# Patient Record
Sex: Female | Born: 1975 | Race: White | Hispanic: No | Marital: Married | Smoking: Never smoker
Health system: Southern US, Community
[De-identification: ages and names within clinical notes are randomized; demographics above are authoritative.]

## PROBLEM LIST (undated history)

## (undated) DIAGNOSIS — N94810 Vulvar vestibulitis: Secondary | ICD-10-CM

## (undated) DIAGNOSIS — Z8619 Personal history of other infectious and parasitic diseases: Secondary | ICD-10-CM

## (undated) DIAGNOSIS — R51 Headache: Secondary | ICD-10-CM

## (undated) DIAGNOSIS — D62 Acute posthemorrhagic anemia: Secondary | ICD-10-CM

## (undated) DIAGNOSIS — F329 Major depressive disorder, single episode, unspecified: Secondary | ICD-10-CM

## (undated) DIAGNOSIS — IMO0002 Reserved for concepts with insufficient information to code with codable children: Secondary | ICD-10-CM

## (undated) DIAGNOSIS — D649 Anemia, unspecified: Secondary | ICD-10-CM

## (undated) DIAGNOSIS — D509 Iron deficiency anemia, unspecified: Secondary | ICD-10-CM

## (undated) DIAGNOSIS — N3090 Cystitis, unspecified without hematuria: Secondary | ICD-10-CM

## (undated) DIAGNOSIS — K649 Unspecified hemorrhoids: Secondary | ICD-10-CM

## (undated) DIAGNOSIS — F419 Anxiety disorder, unspecified: Secondary | ICD-10-CM

## (undated) DIAGNOSIS — F32A Depression, unspecified: Secondary | ICD-10-CM

## (undated) DIAGNOSIS — O99019 Anemia complicating pregnancy, unspecified trimester: Secondary | ICD-10-CM

## (undated) HISTORY — DX: Major depressive disorder, single episode, unspecified: F32.9

## (undated) HISTORY — DX: Unspecified hemorrhoids: K64.9

## (undated) HISTORY — DX: Depression, unspecified: F32.A

## (undated) HISTORY — DX: Anxiety disorder, unspecified: F41.9

## (undated) HISTORY — DX: Personal history of other infectious and parasitic diseases: Z86.19

## (undated) HISTORY — DX: Anemia, unspecified: D64.9

## (undated) HISTORY — PX: NO PAST SURGERIES: SHX2092

## (undated) HISTORY — DX: Reserved for concepts with insufficient information to code with codable children: IMO0002

## (undated) HISTORY — DX: Headache: R51

## (undated) HISTORY — DX: Cystitis, unspecified without hematuria: N30.90

## (undated) HISTORY — DX: Vulvar vestibulitis: N94.810

---

## 2005-04-29 ENCOUNTER — Ambulatory Visit: Admission: RE | Admit: 2005-04-29 | Discharge: 2005-04-29 | Payer: Self-pay | Admitting: *Deleted

## 2009-07-07 ENCOUNTER — Ambulatory Visit: Payer: Self-pay | Admitting: Family Medicine

## 2009-07-07 DIAGNOSIS — J069 Acute upper respiratory infection, unspecified: Secondary | ICD-10-CM | POA: Insufficient documentation

## 2009-07-22 ENCOUNTER — Ambulatory Visit: Payer: Self-pay | Admitting: Family Medicine

## 2009-07-22 DIAGNOSIS — J209 Acute bronchitis, unspecified: Secondary | ICD-10-CM

## 2009-07-22 DIAGNOSIS — R0789 Other chest pain: Secondary | ICD-10-CM

## 2009-07-22 DIAGNOSIS — R079 Chest pain, unspecified: Secondary | ICD-10-CM | POA: Insufficient documentation

## 2009-07-22 DIAGNOSIS — J019 Acute sinusitis, unspecified: Secondary | ICD-10-CM

## 2009-07-30 ENCOUNTER — Ambulatory Visit: Payer: Self-pay | Admitting: Family Medicine

## 2009-08-07 ENCOUNTER — Encounter: Admission: RE | Admit: 2009-08-07 | Discharge: 2009-08-07 | Payer: Self-pay | Admitting: Family Medicine

## 2009-08-20 ENCOUNTER — Ambulatory Visit: Payer: Self-pay | Admitting: Family Medicine

## 2009-08-20 DIAGNOSIS — J189 Pneumonia, unspecified organism: Secondary | ICD-10-CM | POA: Insufficient documentation

## 2010-06-13 NOTE — L&D Delivery Note (Signed)
Delivery Note At 4:07 PM a viable and healthy female was delivered via Vaginal, Spontaneous Delivery (Presentation: Middle Occiput Anterior).  APGAR: 9, 9; weight 8 lb (3629 g).   Placenta status: Intact, Spontaneous.  Cord: 3 vessels with the following complications: None.  Cord pH: none  Anesthesia: Epidural  Episiotomy: none Lacerations: 2nd degree;Perineal; right anterior Vaginal sulcus Suture Repair: 3.0 chromic Est. Blood Loss (mL): 500  Mom to postpartum.  Baby to nursery-stable.  Sheila Burns A 05/01/2011, 6:16 PM

## 2010-07-13 NOTE — Assessment & Plan Note (Signed)
Summary: Cough-clear, ear pain- Both x 4 dys rm 3   Vital Signs:  Patient Profile:   35 Years Old Female CC:      Cold & URI symptoms Height:     67 inches Weight:      160 pounds O2 Sat:      100 % O2 treatment:    Room Air Temp:     98.5 degrees F oral Pulse rate:   85 / minute Pulse rhythm:   regular Resp:     16 per minute BP sitting:   123 / 76  (right arm) Cuff size:   regular  Vitals Entered By: Areta Haber CMA (July 07, 2009 5:42 PM)                  Current Allergies: No known allergies History of Present Illness Chief Complaint: Cold & URI symptoms History of Present Illness: Subjective: Patient complains of URI symptoms for 4 days. + minimal sore throat + cough for 3 days No pleuritic pain No wheezing + nasal congestion No post-nasal drainage No sinus pain/pressure No itchy/red eyes No earache No hemoptysis No SOB No fever/chills No nausea No vomiting No abdominal pain No diarrhea No skin rashes + fatigue + myalgias, mild + mild headache Used OTC meds without relief   REVIEW OF SYSTEMS Constitutional Symptoms      Denies fever, chills, night sweats, weight loss, weight gain, and fatigue.  Eyes       Denies change in vision, eye pain, eye discharge, glasses, contact lenses, and eye surgery. Ear/Nose/Throat/Mouth       Complains of ear pain and hoarseness.      Denies hearing loss/aids, change in hearing, ear discharge, dizziness, frequent runny nose, frequent nose bleeds, sinus problems, sore throat, and tooth pain or bleeding.      Comments: Both - pressure Respiratory       Complains of productive cough.      Denies dry cough, wheezing, shortness of breath, asthma, bronchitis, and emphysema/COPD.  Cardiovascular       Denies murmurs, chest pain, and tires easily with exhertion.    Gastrointestinal       Denies stomach pain, nausea/vomiting, diarrhea, constipation, blood in bowel movements, and indigestion. Genitourniary  Denies painful urination, kidney stones, and loss of urinary control. Neurological       Denies paralysis, seizures, and fainting/blackouts. Musculoskeletal       Denies muscle pain, joint pain, joint stiffness, decreased range of motion, redness, swelling, muscle weakness, and gout.  Skin       Denies bruising, unusual mles/lumps or sores, and hair/skin or nail changes.  Psych       Denies mood changes, temper/anger issues, anxiety/stress, speech problems, depression, and sleep problems. Other Comments: clear x 4dys. Pt has not seen PCP for this.    Objective:  Appearance:  Patient appears healthy, stated age, and in no acute distress  Eyes:  Pupils are equal, round, and reactive to light and accomdation.  Extraocular movement is intact.  Conjunctivae are not inflamed.  Ears:  Canals normal.  Tympanic membranes normal.   Nose:  Normal septum.  Normal turbinates, mildly congested.  No sinus tenderness present.  Pharynx:  Normal  Neck:  Supple.  No adenopathy is present.  No thyromegaly is present  Lungs:  Clear to auscultation.  Breath sounds are equal.  Heart:  Regular rate and rhythm without murmurs, rubs, or gallops.  Abdomen:  Nontender without masses or hepatosplenomegaly.  Bowel sounds are present.  No CVA or flank tenderness.  Skin:  no rash Assessment New Problems: URI (ICD-465.9)  VIRAL URI  Plan New Medications/Changes: AZITHROMYCIN 250 MG TABS (AZITHROMYCIN) Two tabs by mouth on day 1, then 1 tab daily on days 2 through 5  #6 tabs x 0, 07/07/2009, Donna Christen MD BENZONATATE 200 MG CAPS (BENZONATATE) One by mouth HS as needed cough  #12 x 0, 07/07/2009, Donna Christen MD  New Orders: New Patient Level III [phone] Planning Comments:   Treat symptomatically for now:  decongestant/expectorant, cough suppressant at night, increased fluids If not improving 5 to 7 days or if persistent fever develops, add Z-Pack. Follow-up with PCP if not improving.  Recommend flu shot  when well.   The patient and/or caregiver has been counseled thoroughly with regard to medications prescribed including dosage, schedule, interactions, rationale for use, and possible side effects and they verbalize understanding.  Diagnoses and expected course of recovery discussed and will return if not improved as expected or if the condition worsens. Patient and/or caregiver verbalized understanding.  Prescriptions: AZITHROMYCIN 250 MG TABS (AZITHROMYCIN) Two tabs by mouth on day 1, then 1 tab daily on days 2 through 5  #6 tabs x 0   Entered and Authorized by:   Donna Christen MD   Signed by:   Donna Christen MD on 07/07/2009   Method used:   Print then Give to Patient   RxID:   [id number] BENZONATATE 200 MG CAPS (BENZONATATE) One by mouth HS as needed cough  #12 x 0   Entered and Authorized by:   Donna Christen MD   Signed by:   Donna Christen MD on 07/07/2009   Method used:   Print then Give to Patient   RxID:   [id number]   Patient Instructions: 1)  May use Mucinex D (guaifenesin with decongestant) twice daily for congestion. 2)  Increase fluid intake, rest. 3)  May use Afrin nasal spray (or generic oxymetazoline) twice daily for about 5 days.  Also recommend using saline nasal spray several times daily and/or saline nasal irrigation. 4)  Add Azithromycin if not improving 5 to 7 days or if persistent fever develops. 5)  Followup with family doctor if not improving 7 to 10 days.

## 2010-07-13 NOTE — Assessment & Plan Note (Signed)
Summary: NOV PNA   Vital Signs:  Patient profile:   35 year old female Height:      67 inches Weight:      156 pounds BMI:     24.52 O2 Sat:      99 % on Room air Temp:     98.0 degrees F oral Pulse rate:   91 / minute BP sitting:   116 / 73  (right arm) Cuff size:   regular  Vitals Entered By: Payton Spark CMA/April (July 30, 2009 3:35 PM)  O2 Flow:  Room air CC: f/u on urgent care visit, c/o pain in left lung, head congestion x 3 weeks   Primary Care Provider:  Seymour Bars DO  CC:  f/u on urgent care visit, c/o pain in left lung, and head congestion x 3 weeks.  History of Present Illness: Sheila Burns is a 35 year old female with pneumonia. This started 3 weeks ago with a sore throat and bad cough for which she was prescribed azithromycin. This helped the sore throat but she still had a cough and began having nasal congestion and rhinorrhea. Last Monday she began having pain in her left posterior ribs. CXR showed a density in the base of her L lung consistent with L lower lobe pneumonia. She was prescribed a 7-day course of Avelox which she finished two days ago. The pleuritic back pain has stayed the same and she still has nasal congestion, mild cough, and SOB with heavy exertion. No fever in the past few weeks. No chills, night sweats, chest pain, abdominal pain, n/v, diarrhea, constipation, changes in urination, rashes, or swollen joints.   Has taken Advil for pleuritic pain.   Current Medications (verified): 1)  Fluoxetine Hcl 10 Mg Caps (Fluoxetine Hcl) .Marland Kitchen.. 1 Tab By Mouth Once Daily 2)  Wellbutrin Xl 300 Mg Xr24h-Tab (Bupropion Hcl) .Marland Kitchen.. 1 Tab By Mouth Once Daily 3)  Ortho Tri-Cyclen (28) 0.18/0.215/0.25 Mg-35 Mcg Tabs (Norgestim-Eth Estrad Triphasic) .Marland Kitchen.. 1 Tab By Mouth Once Daily 4)  Mucinex Maximum Strength 1200 Mg Xr12h-Tab (Guaifenesin) .... As Directed 5)  Tussionex Pennkinetic Er 8-10 Mg/40ml Lqcr (Chlorpheniramine-Hydrocodone) .Marland Kitchen.. 1 Tsp By Mouth Twice A Day  As  Needed For Cough 6)  Proair Hfa 108 (90 Base) Mcg/act  Aers (Albuterol Sulfate) .... 2 Inh Q4h As Needed Shortness of Breath  Allergies (verified): No Known Drug Allergies  Past History:  Past Medical History: depression OB  Past Surgical History: Reviewed history from 07/22/2009 and no changes required. Denies surgical history  Family History: Mother, Healthy Father, D, Lung CA 2 sibblings ETOH  Social History: Runner, broadcasting/film/video w/Master's degree Lives at home with husband, no kids Non-smoker No ETOH No Drugs  Review of Systems       no fevers/sweats/weakness, unexplained wt loss/gain, no change in vision, no difficulty hearing, ringing in ears, no hay fever/allergies, +CP/discomfort, no palpitations, no breast lump/nipple discharge, + cough/wheeze, no blood in stool, N/V/D, no nocturia, no leaking urine, no unusual vag bleeding, no vaginal/penile discharge, no muscle/joint pain, no rash, no new/changing mole, no HA, no memory loss, no anxiety, no sleep problem, no depression, no unexplained lumps, no easy bruising/bleeding, no concern with sexual function no   Physical Exam  General:  Alert, polite female in no acute distress Head:  Normocephalic and atraumatic Eyes:  conjunctiva cldear Ears:  EACs patent; TMs translucent and gray with good cone of light and bony landmarks.  Nose:  no nasal discharge.   Mouth:  Oropharynx clear without injection or  exudates.  Neck:  no masses.   Chest Wall:  L lateral/ anterior chest wall NTTP with mild splinting on deep inspiration Lungs:  Diminished breath sounds at L base. R lung clear to auscultation. No wheezes or rhonchi.  Heart:  RRR, normal S1 and S2 with no murmur, rub, or gallop.  Abdomen:  soft and non-tender.   Msk:  Normal tone and bulk. Pulses:  2+ bilaterally.  Extremities:  Warm and well-perfused with no edema. Normal cap refill. Skin:  Normal color, no cyanosis.  Psych:  Appropriate and cooperative with normal concentration  and attention.    Impression & Recommendations:  Problem # 1:  BACTERIAL PNEUMONIA (ICD-482.9) Has shown some clinical improvement with 7-day course of Avelox. Nasal congestion has diminished somewhat and cough has mostly resolved; however, L pleuritic flank/back pain and some nasal congestion are still present. Will continue Avelox for 5 more days for a total of a 12-day course. Continue Advil for pleuritic pain and use Mucinex-DM as needed for cough. Follow-up CXR scheduled for next Friday 2/25.  Her updated medication list for this problem includes:    Avelox 400 Mg Tabs (Moxifloxacin hcl) .Marland Kitchen... 1 tab by mouth once daily  Reviewed UC notes and CXR from 10 days ago.  Complete Medication List: 1)  Fluoxetine Hcl 10 Mg Caps (Fluoxetine hcl) .Marland Kitchen.. 1 tab by mouth once daily 2)  Wellbutrin Xl 300 Mg Xr24h-tab (Bupropion hcl) .Marland Kitchen.. 1 tab by mouth once daily 3)  Ortho Tri-cyclen (28) 0.18/0.215/0.25 Mg-35 Mcg Tabs (Norgestim-eth estrad triphasic) .Marland Kitchen.. 1 tab by mouth once daily 4)  Mucinex Maximum Strength 1200 Mg Xr12h-tab (Guaifenesin) .... As directed 5)  Tussionex Pennkinetic Er 8-10 Mg/96ml Lqcr (Chlorpheniramine-hydrocodone) .Marland Kitchen.. 1 tsp by mouth twice a day  as needed for cough 6)  Proair Hfa 108 (90 Base) Mcg/act Aers (Albuterol sulfate) .... 2 inh q4h as needed shortness of breath 7)  Avelox 400 Mg Tabs (Moxifloxacin hcl) .Marland Kitchen.. 1 tab by mouth once daily  Other Orders: T-DG Chest 2 View [phone])  Patient Instructions: 1)  Repeat CXR on Friday 08-07-2009. 2)  I will call you w/ the results. 3)  Take Avelox 1 tab daily for 5 more days. 4)  Use Mucinex DM as needed for cough. 5)  Use Advil as needed for pleuritic chest pain. Prescriptions: AVELOX 400 MG TABS (MOXIFLOXACIN HCL) 1 tab by mouth once daily  #3 tabs x 0   Entered and Authorized by:   Seymour Bars DO   Signed by:   Seymour Bars DO on 07/30/2009   Method used:   Electronically to        [facility] [phone]* (retail)       [address]       [place], [place]  [address]       Ph: [phone] or [phone]       Fax: [phone]   RxID:   [id number]

## 2010-07-13 NOTE — Letter (Signed)
Summary: Out of Work  [facility]  [address]   [place], [place] [address]   Phone: [phone]  Fax: [phone]    July 22, 2009   Employee:  Garrie J Dizdarevic    To Whom It May Concern:   For Medical reasons, please excuse the above named employee from work for the following dates:  Start:   07/22/2009  Return:   07/25/2009  If you need additional information, please feel free to contact our office.         Sincerely,    Hassan Rowan MD

## 2010-07-13 NOTE — Assessment & Plan Note (Signed)
Summary: f/u PNA   Vital Signs:  Patient profile:   35 year old female Height:      67 inches Weight:      155 pounds BMI:     24.36 O2 Sat:      100 % on Room air Temp:     98.2 degrees F oral Pulse rate:   64 / minute BP sitting:   120 / 65  (left arm) Cuff size:   regular  Vitals Entered By: Payton Spark CMA (August 20, 2009 3:20 PM)  O2 Flow:  Room air CC: F/U pneumo. Still improving   Primary Care Provider:  Seymour Bars DO  CC:  F/U pneumo. Still improving.  History of Present Illness: Sheila Burns presents for f/u pneumonia with pleuritic chest pain which has improved.  Her appetite and energy level have greatly improved.  No fevers or chills.  No SOB.  Has pain with sneezing in her chest wall but is not coughing any more.  She is sleeping well at night.  She stopped the Advil.  Her f/u CXR was improved.  She has some mild seasonal allergies.  No hx of asthma.    Current Medications (verified): 1)  Fluoxetine Hcl 10 Mg Caps (Fluoxetine Hcl) .Marland Kitchen.. 1 Tab By Mouth Once Daily 2)  Wellbutrin Xl 300 Mg Xr24h-Tab (Bupropion Hcl) .Marland Kitchen.. 1 Tab By Mouth Once Daily 3)  Ortho Tri-Cyclen (28) 0.18/0.215/0.25 Mg-35 Mcg Tabs (Norgestim-Eth Estrad Triphasic) .Marland Kitchen.. 1 Tab By Mouth Once Daily  Allergies (verified): No Known Drug Allergies  Past History:  Past Medical History: Reviewed history from 07/30/2009 and no changes required. depression OB  Social History: Reviewed history from 07/30/2009 and no changes required. 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   Head:  normocephalic and atraumatic.   Eyes:  conjunctiva  clear Nose:  no nasal discharge.   Mouth:  good dentition and pharynx pink and moist.   Neck:  no masses.   Lungs:  Normal respiratory effort, chest expands symmetrically. Lungs are clear to  auscultation, no crackles or wheezes. Heart:  Normal rate and regular rhythm. S1 and S2 normal without gallop, murmur, click, rub or other extra sounds. Skin:  color normal.   Cervical Nodes:  No lymphadenopathy noted Psych:  good eye contact, not anxious appearing, and not depressed appearing.     Impression & Recommendations:  Problem # 1:  PNEUMONIA, ORGANISM UNSPECIFIED (ICD-486) REsolved.  Due to hx of bacterial PNA, PNX given today. Finsihed out Avelox.  F/U CXR OK.  The following medications were removed from the medication list:    Avelox 400 Mg Tabs (Moxifloxacin hcl) .Marland Kitchen... 1 tab by mouth once daily  Problem # 2:  CHEST PAIN, OTHER, PAIN (ICD-786.59) Assessment: Improved Pleuritic CP has improved about 80% and she is off Advil.   Expect this to completely resolve in the next couple wks.  Complete Medication List: 1)  Fluoxetine Hcl 10 Mg Caps (Fluoxetine hcl) .Marland Kitchen.. 1 tab by mouth once daily 2)  Wellbutrin Xl 300 Mg Xr24h-tab (Bupropion hcl) .Marland Kitchen.. 1 tab by mouth once daily 3)  Ortho Tri-cyclen (28) 0.18/0.215/0.25 Mg-35 Mcg Tabs (Norgestim-eth estrad triphasic) .Marland Kitchen.. 1 tab by mouth once daily  Other Orders: T-Comprehensive Metabolic Panel [phone]) T-Lipid Profile ([id number]) Pneumococcal Vaccine (56433) Admin 1st Vaccine (29518) Admin 1st Vaccine (  State) [phone])  Patient Instructions: 1)  Have fasting labs drawn. 2)  Use Claritin or Zyrtec as needed allergies. 3)  Return for follow up as needed.   Pneumovax Vaccine    Vaccine Type: Pneumovax    Site: right deltoid    Dose: 0.5 ml    Route: IM    Given by: Payton Spark CMA    Exp. Date: 07/14/2010    Lot #: [id number]    VIS given: 01/09/96 version given August 20, 2009.

## 2010-07-13 NOTE — Assessment & Plan Note (Signed)
Summary: SINUS INFECTION LEFT SIDE/TJ   Vital Signs:  Patient Profile:   35 Years Old Female CC:      Cough, congestion, runny nose x 2 weeks, left flank pain x 1 week worse 2 days Height:     67 inches O2 Sat:      99 % Temp:     97.5 degrees F oral Pulse rate:   78 / minute Pulse rhythm:   regular Resp:     12 per minute BP sitting:   107 / 73  (right arm) Cuff size:   regular  Vitals Entered By: Emilio Math (July 22, 2009 4:30 PM)                  Prior Medication List:  FLUOXETINE HCL 10 MG CAPS (FLUOXETINE HCL) 1 tab by mouth once daily WELLBUTRIN XL 300 MG XR24H-TAB (BUPROPION HCL) 1 tab by mouth once daily ORTHO TRI-CYCLEN (28) 0.18/0.215/0.25 MG-35 MCG TABS (NORGESTIM-ETH ESTRAD TRIPHASIC) 1 tab by mouth once daily MUCINEX MAXIMUM STRENGTH 1200 MG XR12H-TAB (GUAIFENESIN) As directed BENZONATATE 200 MG CAPS (BENZONATATE) One by mouth HS as needed cough AZITHROMYCIN 250 MG TABS (AZITHROMYCIN) Two tabs by mouth on day 1, then 1 tab daily on days 2 through 5   Current Allergies: No known allergies History of Present Illness Chief Complaint: Cough, congestion, runny nose x 2 weeks, left flank pain x 1 week worse 2 days History of Present Illness: Here a couple of weeks for a bad cough. Seen and treated got a little better but the sinus problem continued and now w/ pain over the L posterior ribs. Hx ox sinus infections before this sems consistent w/ that.   Current Problems: BACTERIAL PNEUMONIA (ICD-482.9) ACUTE SINUSITIS, UNSPECIFIED (ICD-461.9) RIB PAIN, LEFT SIDED (ICD-786.50) CHEST PAIN, OTHER, PAIN (ICD-786.59) BRONCHITIS, ACUTE WITH BRONCHOSPASM (ICD-466.0) URI (ICD-465.9)   Current Meds FLUOXETINE HCL 10 MG CAPS (FLUOXETINE HCL) 1 tab by mouth once daily WELLBUTRIN XL 300 MG XR24H-TAB (BUPROPION HCL) 1 tab by mouth once daily ORTHO TRI-CYCLEN (28) 0.18/0.215/0.25 MG-35 MCG TABS (NORGESTIM-ETH ESTRAD TRIPHASIC) 1 tab by mouth once daily MUCINEX MAXIMUM  STRENGTH 1200 MG XR12H-TAB (GUAIFENESIN) As directed * AVELOX 400 MG  TABS (MOXIFLOXACIN HCL) #7/OR LEVAQUIN 750 #5 1 tablet by mouth dailythe avelox x 7 days, or levaquin for 5 days TUSSIONEX PENNKINETIC ER 8-10 MG/5ML LQCR (CHLORPHENIRAMINE-HYDROCODONE) 1 tsp by mouth twice a day  as needed for cough PROAIR HFA 108 (90 BASE) MCG/ACT  AERS (ALBUTEROL SULFATE) 2 inh q4h as needed shortness of breath  REVIEW OF SYSTEMS Constitutional Symptoms       Complains of fever.     Denies chills, night sweats, weight loss, weight gain, and fatigue.      Comments: that is better Eyes       Denies change in vision, eye pain, eye discharge, glasses, contact lenses, and eye surgery. Ear/Nose/Throat/Mouth       Complains of frequent runny nose and sinus problems.      Denies hearing loss/aids, change in hearing, ear pain, ear discharge, dizziness, frequent nose bleeds, sore throat, hoarseness, and tooth pain or bleeding.  Respiratory       Complains of dry cough.      Denies productive cough, wheezing, shortness of breath, asthma, bronchitis, and emphysema/COPD.      Comments: L rib pain Cardiovascular       Denies murmurs, chest pain, and tires easily with exhertion.    Gastrointestinal  Denies stomach pain, nausea/vomiting, diarrhea, constipation, blood in bowel movements, and indigestion. Genitourniary       Denies painful urination, kidney stones, and loss of urinary control. Neurological       Denies paralysis, seizures, and fainting/blackouts. Musculoskeletal       Denies muscle pain, joint pain, joint stiffness, decreased range of motion, redness, swelling, muscle weakness, and gout.      Comments: Lrib  pain Skin       Denies bruising, unusual mles/lumps or sores, and hair/skin or nail changes.  Psych       Denies mood changes, temper/anger issues, anxiety/stress, speech problems, depression, and sleep problems.  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 Mother, Healthy Father, D, Lung CA  Social History: Reviewed history and no changes required. 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 Physical Exam General appearance: well developed, well nourished, mild distress Head: normocephalic, atraumatic Neck: neck supple,  trachea midline, no masses Chest/Lungs: scattered wheezes L chest and rib tenderness Heart: regular rate and  rhythm, no murmur Extremities: normal extremities Skin: no obvious rashes or lesions MSE: oriented to time, place, and person Assessment New Problems: BACTERIAL PNEUMONIA (ICD-482.9) ACUTE SINUSITIS, UNSPECIFIED (ICD-461.9) RIB PAIN, LEFT SIDED (ICD-786.50) CHEST PAIN, OTHER, PAIN (ICD-786.59) BRONCHITIS, ACUTE WITH BRONCHOSPASM (ICD-466.0)  pneumonia, sinusitis  Patient Education: Patient and/or caregiver instructed in the following: rest fluids and Tylenol.  Plan New Medications/Changes: PROAIR HFA 108 (90 BASE) MCG/ACT  AERS (ALBUTEROL SULFATE) 2 inh q4h as needed shortness of breath  #1 x 0, 07/22/2009, Hassan Rowan MD TUSSIONEX PENNKINETIC ER 8-10 MG/5ML LQCR (CHLORPHENIRAMINE-HYDROCODONE) 1 tsp by mouth twice a day  as needed for cough  #76fl oz x 0, 07/22/2009, Hassan Rowan MD AVELOX 400 MG  TABS (MOXIFLOXACIN HCL) #7/OR LEVAQUIN 750 #5 1 tablet by mouth dailythe avelox x 7 days, or levaquin for 5 days  #7 x 0, 07/22/2009, Hassan Rowan MD  New Orders: T-Chest x-ray, 2 views [71020] T-Ribs Unilateral 2 Views [71100TC] Est. Patient Level III [46962] Follow Up: Follow up with Primary Physician Work/School Excuse: Return to work/school in 3 days  The patient and/or caregiver has been counseled thoroughly with regard to medications prescribed  including dosage, schedule, interactions, rationale for use, and possible side effects and they verbalize understanding.  Diagnoses and expected course of recovery discussed and will return if not improved as expected or if the condition worsens. Patient and/or caregiver verbalized understanding.  Prescriptions: PROAIR HFA 108 (90 BASE) MCG/ACT  AERS (ALBUTEROL SULFATE) 2 inh q4h as needed shortness of breath  #1 x 0   Entered and Authorized by:   Hassan Rowan MD   Signed by:   Hassan Rowan MD on 07/22/2009   Method used:   Printed then faxed to ...       [facility] [phone]* (retail)       [address]       [place], [place]  [address]       Ph: [phone]       Fax: [phone]   RxID:   [id number] Sandria Senter ER 8-10 MG/5ML LQCR (CHLORPHENIRAMINE-HYDROCODONE) 1 tsp by mouth twice a day  as needed for cough  #21fl oz x 0  Entered and Authorized by:   Hassan Rowan MD   Signed by:   Hassan Rowan MD on 07/22/2009   Method used:   Printed then faxed to ...       [facility] [phone]* (retail)       [address]       [place], [place]  [address]       Ph: [phone]       Fax: [phone]   RxID:   [id number] AVELOX 400 MG  TABS (MOXIFLOXACIN HCL) #7/OR LEVAQUIN 750 #5 1 tablet by mouth dailythe avelox x 7 days, or levaquin for 5 days  #7 x 0   Entered and Authorized by:   Hassan Rowan MD   Signed by:   Hassan Rowan MD on 07/22/2009   Method used:   Printed then faxed to ...       [facility] [phone]* (retail)       [address]       [place], [place]  [address]       Ph: [phone]       Fax: [phone]   RxID:   [id number]   Patient Instructions: 1)  Please schedule a follow-up appointment in 2 weeks. 2)  Please schedule an appointment with your primary doctor in :2 weeks for repeat CXR 3)  Take your antibiotic as prescribed until ALL of it is gone, but stop if you  develop a rash or swelling and contact our office as soon as possible. 4)  Acute sinusitis symptoms for less than 10 days are not helped by antibiotics.Use warm moist compresses, and over the counter decongestants ( only as directed). Call if no improvement in 5-7 days, sooner if increasing pain, fever, or new symptoms. 5)  Recommended remaining out of work for 2-3 days

## 2010-09-15 LAB — HIV ANTIBODY (ROUTINE TESTING W REFLEX): HIV: NONREACTIVE

## 2010-09-15 LAB — ABO/RH: RH Type: POSITIVE

## 2010-09-15 LAB — RUBELLA ANTIBODY, IGM: Rubella: IMMUNE

## 2010-09-15 LAB — RPR: RPR: NONREACTIVE

## 2011-01-31 IMAGING — CR DG RIBS 2V*L*
2 series · 2 of 2 positions shown · non-contrast
Comparison: Two-view chest film today.

CLINICAL DATA: Left rib pain.

LEFT RIBS - 2 VIEW

[view not recorded (1 of 2)]
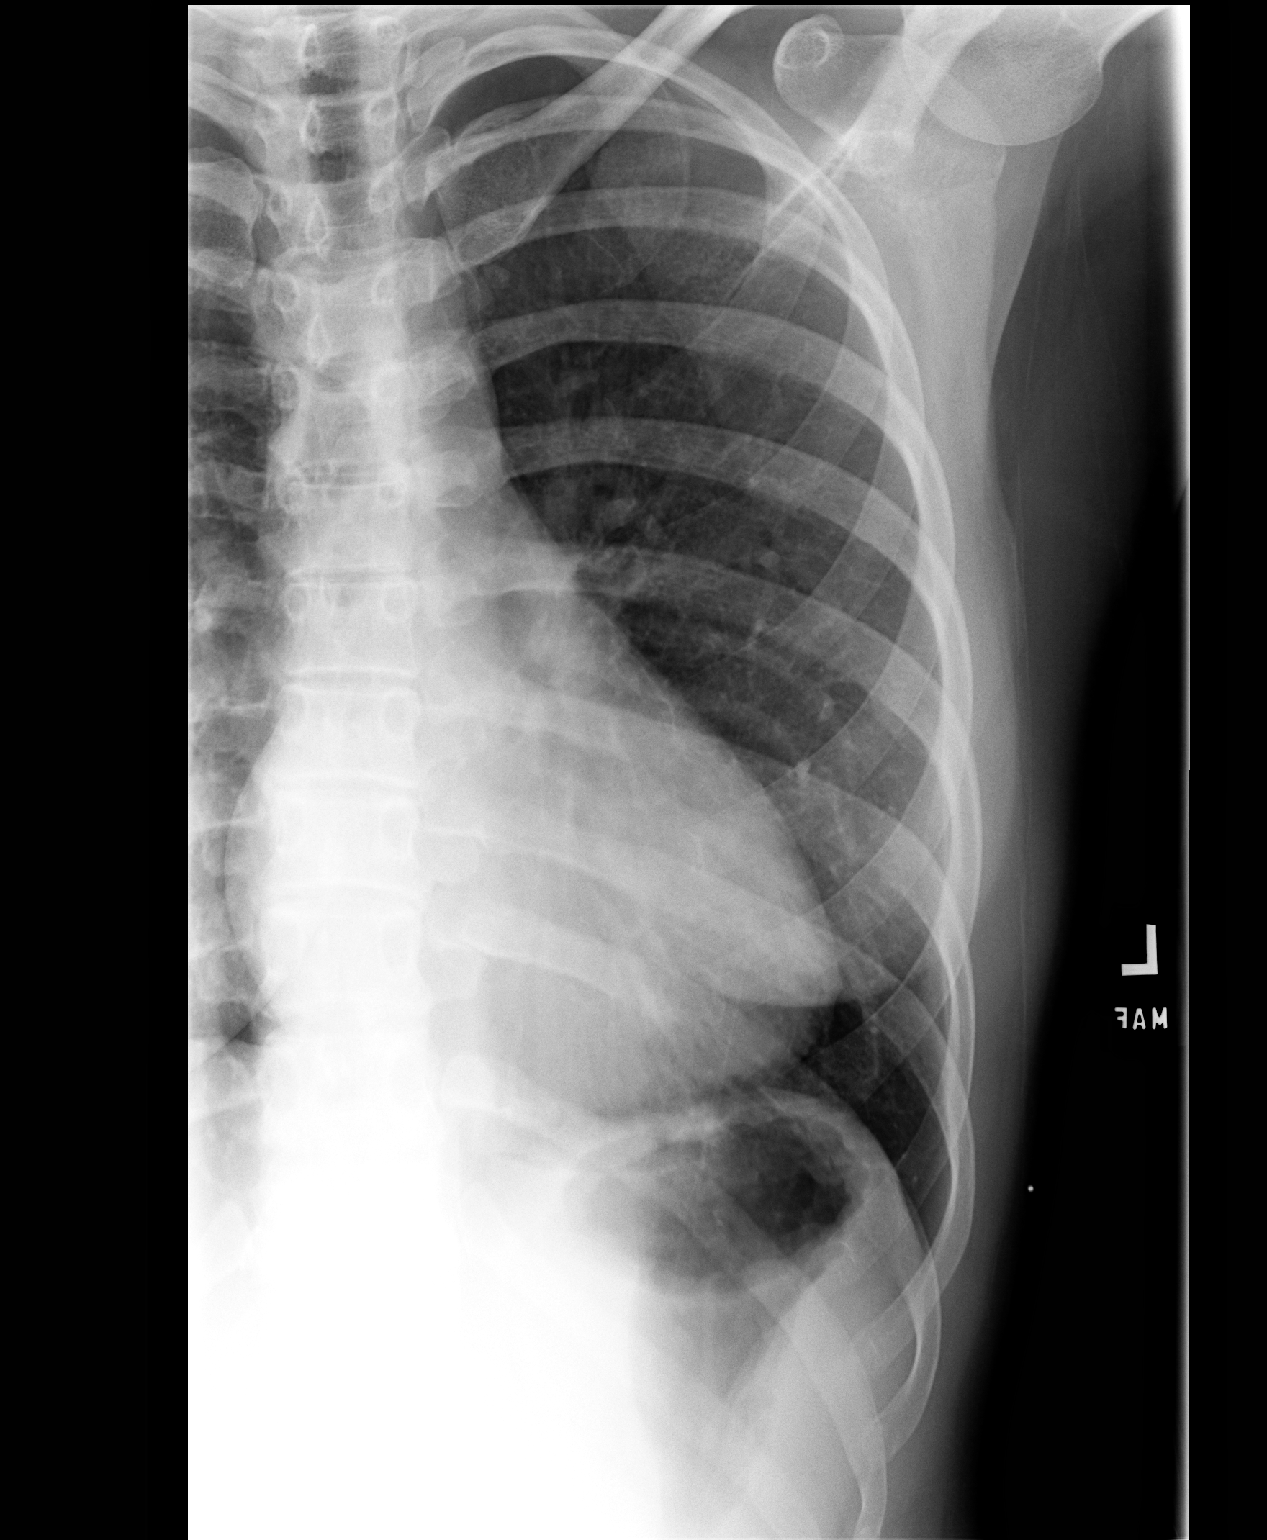

[view not recorded (2 of 2)]
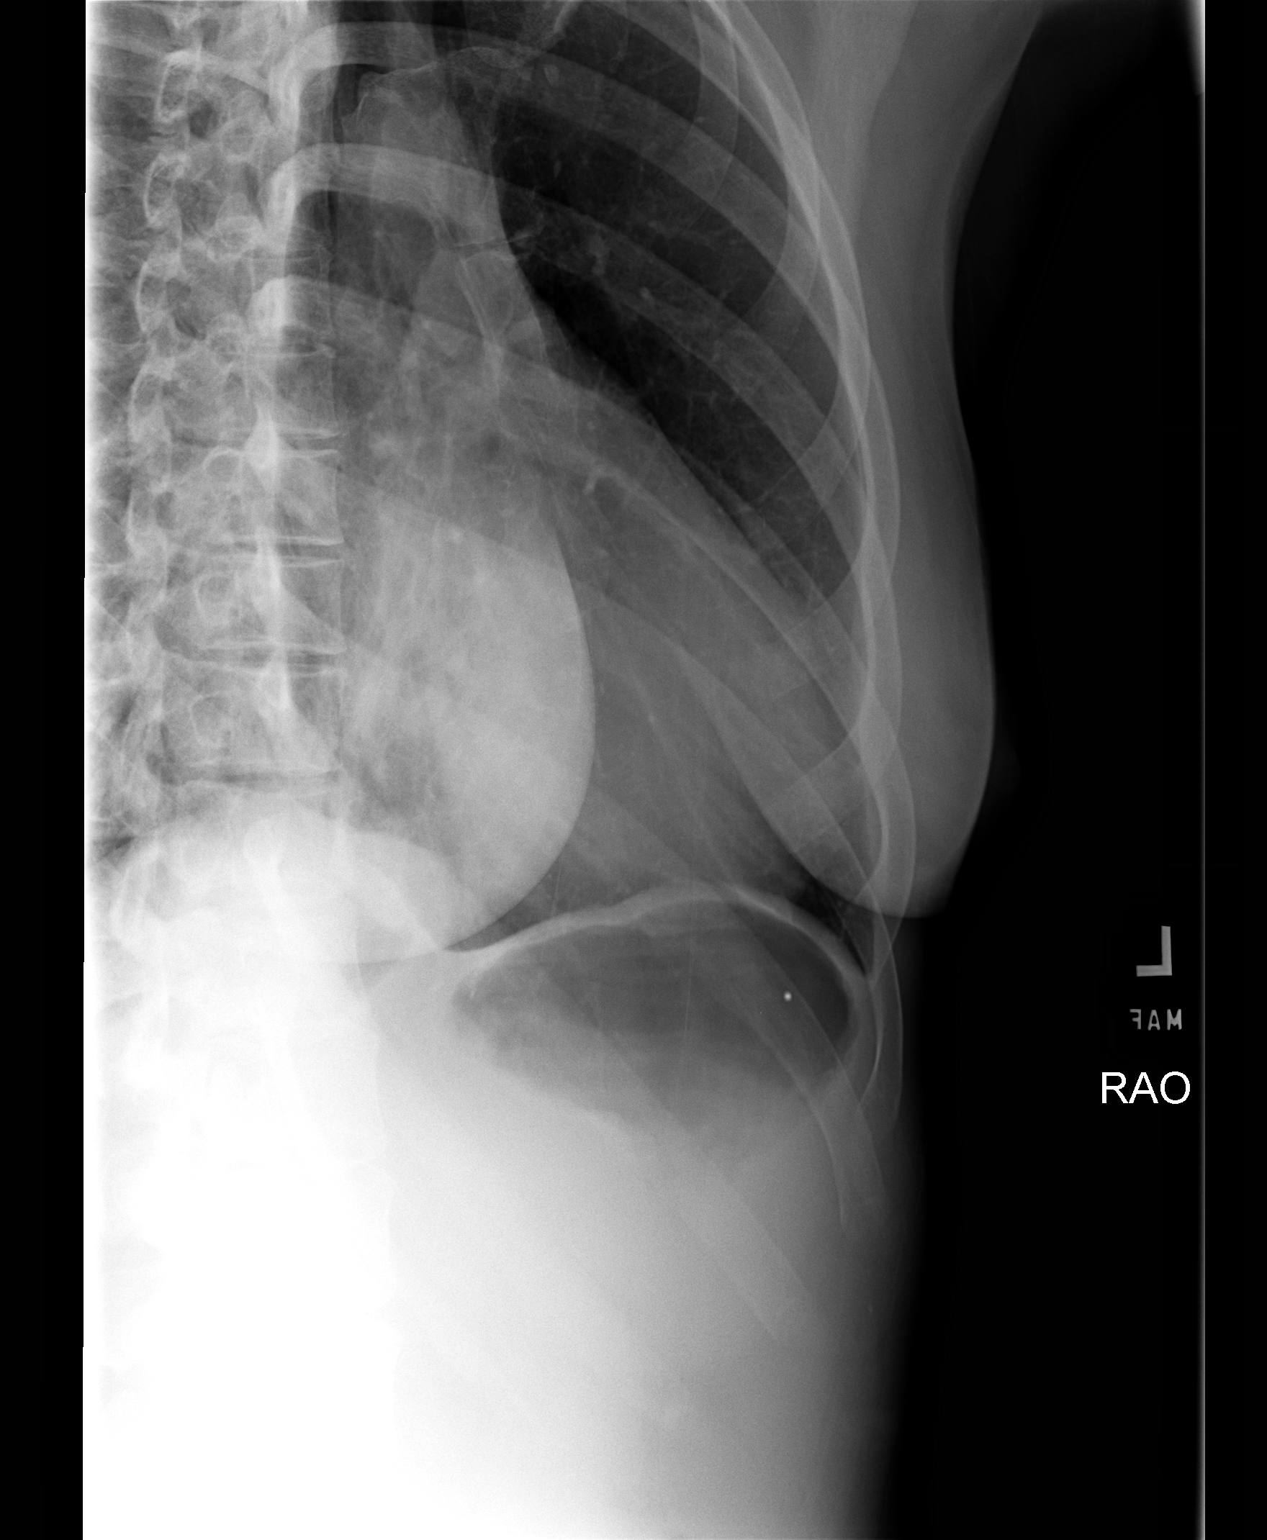

[2 of 2 positions shown; findings below may reference images not displayed]

FINDINGS: Left ribs show no evidence of fracture or bony lesion.
There is subtle infiltrate in the left lower lung.
IMPRESSION: Normal left ribs.

## 2011-04-25 ENCOUNTER — Inpatient Hospital Stay (HOSPITAL_COMMUNITY): Admission: AD | Admit: 2011-04-25 | Payer: Self-pay | Source: Ambulatory Visit | Admitting: Obstetrics & Gynecology

## 2011-04-29 ENCOUNTER — Telehealth (HOSPITAL_COMMUNITY): Payer: Self-pay | Admitting: *Deleted

## 2011-04-29 ENCOUNTER — Encounter (HOSPITAL_COMMUNITY): Payer: Self-pay | Admitting: *Deleted

## 2011-04-29 NOTE — Telephone Encounter (Signed)
Preadmission screen  

## 2011-04-30 ENCOUNTER — Encounter (HOSPITAL_COMMUNITY): Payer: Self-pay

## 2011-04-30 ENCOUNTER — Inpatient Hospital Stay (HOSPITAL_COMMUNITY)
Admission: RE | Admit: 2011-04-30 | Discharge: 2011-05-03 | DRG: 775 | Disposition: A | Discharge status: Home / Self Care | Payer: 59 | Source: Ambulatory Visit | Attending: Obstetrics and Gynecology | Admitting: Obstetrics and Gynecology

## 2011-04-30 VITALS — BP 107/69 | HR 73 | Temp 97.5°F | Resp 18 | Ht 67.0 in | Wt 200.0 lb

## 2011-04-30 DIAGNOSIS — O480 Post-term pregnancy: Secondary | ICD-10-CM | POA: Diagnosis present

## 2011-04-30 DIAGNOSIS — O9903 Anemia complicating the puerperium: Secondary | ICD-10-CM | POA: Diagnosis not present

## 2011-04-30 DIAGNOSIS — Z22330 Carrier of Group B streptococcus: Secondary | ICD-10-CM

## 2011-04-30 DIAGNOSIS — O99892 Other specified diseases and conditions complicating childbirth: Secondary | ICD-10-CM | POA: Diagnosis present

## 2011-04-30 DIAGNOSIS — D62 Acute posthemorrhagic anemia: Secondary | ICD-10-CM | POA: Diagnosis not present

## 2011-04-30 DIAGNOSIS — Z3490 Encounter for supervision of normal pregnancy, unspecified, unspecified trimester: Secondary | ICD-10-CM

## 2011-04-30 DIAGNOSIS — O3660X0 Maternal care for excessive fetal growth, unspecified trimester, not applicable or unspecified: Secondary | ICD-10-CM | POA: Diagnosis present

## 2011-04-30 DIAGNOSIS — F329 Major depressive disorder, single episode, unspecified: Secondary | ICD-10-CM

## 2011-04-30 LAB — CBC
HCT: 38 % (ref 36.0–46.0)
Hemoglobin: 12.8 g/dL (ref 12.0–15.0)
RDW: 14.3 % (ref 11.5–15.5)
WBC: 13.7 10*3/uL — ABNORMAL HIGH (ref 4.0–10.5)

## 2011-04-30 MED ORDER — MISOPROSTOL 25 MCG QUARTER TABLET
25.0000 ug | ORAL_TABLET | ORAL | Status: DC | PRN
  Administered 2011-04-30: 25 ug via VAGINAL
  Filled 2011-04-30 (×2): qty 0.25

## 2011-04-30 MED ORDER — OXYTOCIN 20 UNITS IN LACTATED RINGERS INFUSION - SIMPLE: 125.0000 mL/h | Freq: Once | INTRAVENOUS | Status: DC

## 2011-04-30 MED ORDER — OXYTOCIN 10 UNIT/ML IJ SOLN: 10.0000 [IU] | Freq: Once | INTRAMUSCULAR | Status: DC

## 2011-04-30 MED ORDER — OXYTOCIN 20 UNITS IN LACTATED RINGERS INFUSION - SIMPLE
1.0000 m[IU]/min | INTRAVENOUS | Status: DC
  Filled 2011-04-30: qty 1000

## 2011-04-30 MED ORDER — CITRIC ACID-SODIUM CITRATE 334-500 MG/5ML PO SOLN
30.0000 mL | ORAL | Status: DC | PRN
  Filled 2011-04-30: qty 15

## 2011-04-30 MED ORDER — ONDANSETRON HCL 4 MG/2ML IJ SOLN: 4.0000 mg | Freq: Four times a day (QID) | INTRAMUSCULAR | Status: DC | PRN

## 2011-04-30 MED ORDER — ACETAMINOPHEN 325 MG PO TABS: 650.0000 mg | ORAL_TABLET | ORAL | Status: DC | PRN

## 2011-04-30 MED ORDER — OXYTOCIN BOLUS FROM INFUSION
500.0000 mL | Freq: Once | INTRAVENOUS | Status: DC
  Filled 2011-04-30: qty 500

## 2011-04-30 MED ORDER — IBUPROFEN 600 MG PO TABS
600.0000 mg | ORAL_TABLET | Freq: Four times a day (QID) | ORAL | Status: DC | PRN
  Administered 2011-05-01: 600 mg via ORAL
  Filled 2011-04-30: qty 1

## 2011-04-30 MED ORDER — PENICILLIN G POTASSIUM 5000000 UNITS IJ SOLR
2.5000 10*6.[IU] | INTRAVENOUS | Status: DC
  Administered 2011-05-01 (×4): 2.5 10*6.[IU] via INTRAVENOUS
  Filled 2011-04-30 (×8): qty 2.5

## 2011-04-30 MED ORDER — LACTATED RINGERS IV SOLN
INTRAVENOUS | Status: DC
  Administered 2011-04-30 – 2011-05-01 (×3): via INTRAVENOUS

## 2011-04-30 MED ORDER — LIDOCAINE HCL (PF) 1 % IJ SOLN: 30.0000 mL | INTRAMUSCULAR | Status: DC | PRN

## 2011-04-30 MED ORDER — ZOLPIDEM TARTRATE 10 MG PO TABS
10.0000 mg | ORAL_TABLET | Freq: Every evening | ORAL | Status: DC | PRN
  Administered 2011-05-01: 10 mg via ORAL
  Filled 2011-04-30: qty 1

## 2011-04-30 MED ORDER — TERBUTALINE SULFATE 1 MG/ML IJ SOLN: 0.2500 mg | Freq: Once | INTRAMUSCULAR | Status: AC | PRN

## 2011-04-30 MED ORDER — PENICILLIN G POTASSIUM 5000000 UNITS IJ SOLR
5.0000 10*6.[IU] | Freq: Once | INTRAVENOUS | Status: AC
  Administered 2011-04-30: 5 10*6.[IU] via INTRAVENOUS
  Filled 2011-04-30: qty 5

## 2011-04-30 MED ORDER — OXYCODONE-ACETAMINOPHEN 5-325 MG PO TABS
2.0000 | ORAL_TABLET | ORAL | Status: DC | PRN
  Administered 2011-05-01: 2 via ORAL
  Filled 2011-04-30: qty 2

## 2011-04-30 MED ORDER — LACTATED RINGERS IV SOLN
500.0000 mL | INTRAVENOUS | Status: DC | PRN
  Administered 2011-05-01: 1000 mL via INTRAVENOUS
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
## 2011-04-30 NOTE — Progress Notes (Signed)
  Sheila Burns is a 35 y.o. G1P0 at [redacted]w[redacted]d by LMP admitted for induction of labor due to Post dates. Due date 04/24/2011.  Subjective: Comfortable, tightening noted with occ ctx's, spouse supportive at [facility], good EFM  Objective: BP 120/76  Pulse 86  Temp(Src) 98 F (36.7 C) (Oral)  Resp 18  Ht 5\' 7"  (1.702 m)  Wt 90.719 kg (200 lb)  BMI 31.32 kg/m2  LMP 07/18/2010  Breastfeeding? Unknown      FHT:  FHR: 120 bpm, variability: moderate,  accelerations:  Present,  decelerations:  Absent UC:   Occasional and mild SVE:   Dilation: 1.5 Effacement (%): 70 Station: -2 Exam by:: Colon Flattery CNM Cytotec 25 mcg placed in posterior fornix, patient tolerated procedure with coaxing, teary. Labs: Lab Results  Component Value Date   WBC 13.7* 04/30/2011   HGB 12.8 04/30/2011   HCT 38.0 04/30/2011   MCV 91.1 04/30/2011   PLT 297 04/30/2011    Assessment / Plan: Induction of labor due to PD, LGA,  progressing well on pitocin  Labor: Cytotec induction started Preeclampsia:  n/a Fetal Wellbeing:  Category I Pain Control:  Labor support without medications I/D:  PCN per protocol for GBS (+) status Anticipated MOD:  NSVD Dr. Cherly Hensen to follow.  Sheila Burns 04/30/2011, 10:15 PM

## 2011-05-01 ENCOUNTER — Encounter (HOSPITAL_COMMUNITY): Payer: Self-pay | Admitting: Anesthesiology

## 2011-05-01 ENCOUNTER — Encounter (HOSPITAL_COMMUNITY): Payer: Self-pay

## 2011-05-01 ENCOUNTER — Inpatient Hospital Stay (HOSPITAL_COMMUNITY): Payer: 59 | Admitting: Anesthesiology

## 2011-05-01 DIAGNOSIS — F32A Depression, unspecified: Secondary | ICD-10-CM | POA: Diagnosis present

## 2011-05-01 DIAGNOSIS — O480 Post-term pregnancy: Secondary | ICD-10-CM | POA: Diagnosis present

## 2011-05-01 DIAGNOSIS — F329 Major depressive disorder, single episode, unspecified: Secondary | ICD-10-CM | POA: Diagnosis present

## 2011-05-01 LAB — CBC
Hemoglobin: 10.4 g/dL — ABNORMAL LOW (ref 12.0–15.0)
MCH: 30.7 pg (ref 26.0–34.0)
RBC: 3.39 MIL/uL — ABNORMAL LOW (ref 3.87–5.11)

## 2011-05-01 LAB — ABO/RH: ABO/RH(D): O POS

## 2011-05-01 LAB — RPR: RPR Ser Ql: NONREACTIVE

## 2011-05-01 MED ORDER — IBUPROFEN 600 MG PO TABS
600.0000 mg | ORAL_TABLET | Freq: Four times a day (QID) | ORAL | Status: DC
  Administered 2011-05-01 – 2011-05-03 (×6): 600 mg via ORAL
  Filled 2011-05-01 (×6): qty 1

## 2011-05-01 MED ORDER — ONDANSETRON HCL 4 MG/2ML IJ SOLN: 4.0000 mg | INTRAMUSCULAR | Status: DC | PRN

## 2011-05-01 MED ORDER — BENZOCAINE-MENTHOL 20-0.5 % EX AERO
1.0000 "application " | INHALATION_SPRAY | CUTANEOUS | Status: DC | PRN
  Administered 2011-05-01: 1 via TOPICAL

## 2011-05-01 MED ORDER — LACTATED RINGERS IV SOLN
INTRAVENOUS | Status: DC
  Administered 2011-05-01 (×2): via INTRAUTERINE

## 2011-05-01 MED ORDER — BUPROPION HCL ER (XL) 150 MG PO TB24
150.0000 mg | ORAL_TABLET | Freq: Every day | ORAL | Status: DC
  Administered 2011-05-01: 150 mg via ORAL
  Filled 2011-05-01 (×2): qty 1

## 2011-05-01 MED ORDER — SIMETHICONE 80 MG PO CHEW: 80.0000 mg | CHEWABLE_TABLET | ORAL | Status: DC | PRN

## 2011-05-01 MED ORDER — DIPHENHYDRAMINE HCL 25 MG PO CAPS: 25.0000 mg | ORAL_CAPSULE | Freq: Four times a day (QID) | ORAL | Status: DC | PRN

## 2011-05-01 MED ORDER — LACTATED RINGERS IV SOLN
500.0000 mL | Freq: Once | INTRAVENOUS | Status: AC
  Administered 2011-05-01: 500 mL via INTRAVENOUS

## 2011-05-01 MED ORDER — FENTANYL 2.5 MCG/ML BUPIVACAINE 1/10 % EPIDURAL INFUSION (WH - ANES)
14.0000 mL/h | INTRAMUSCULAR | Status: DC
  Administered 2011-05-01 (×2): 14 mL/h via EPIDURAL
  Filled 2011-05-01 (×3): qty 60

## 2011-05-01 MED ORDER — DIPHENHYDRAMINE HCL 50 MG/ML IJ SOLN
12.5000 mg | INTRAMUSCULAR | Status: DC | PRN
  Administered 2011-05-01: 12.5 mg via INTRAVENOUS
  Filled 2011-05-01: qty 1

## 2011-05-01 MED ORDER — LIDOCAINE HCL 1.5 % IJ SOLN
INTRAMUSCULAR | Status: DC | PRN
  Administered 2011-05-01: 4 mL via INTRADERMAL
  Administered 2011-05-01: 4 mL via EPIDURAL

## 2011-05-01 MED ORDER — DIBUCAINE 1 % RE OINT: 1.0000 "application " | TOPICAL_OINTMENT | RECTAL | Status: DC | PRN

## 2011-05-01 MED ORDER — WITCH HAZEL-GLYCERIN EX PADS: 1.0000 "application " | MEDICATED_PAD | CUTANEOUS | Status: DC | PRN

## 2011-05-01 MED ORDER — LANOLIN HYDROUS EX OINT: TOPICAL_OINTMENT | CUTANEOUS | Status: DC | PRN

## 2011-05-01 MED ORDER — BENZOCAINE-MENTHOL 20-0.5 % EX AERO
INHALATION_SPRAY | CUTANEOUS | Status: AC
  Administered 2011-05-01: 1 via TOPICAL
  Filled 2011-05-01: qty 56

## 2011-05-01 MED ORDER — FERROUS SULFATE 325 (65 FE) MG PO TABS
325.0000 mg | ORAL_TABLET | Freq: Two times a day (BID) | ORAL | Status: DC
  Administered 2011-05-02 – 2011-05-03 (×3): 325 mg via ORAL
  Filled 2011-05-01 (×3): qty 1

## 2011-05-01 MED ORDER — PHENYLEPHRINE 40 MCG/ML (10ML) SYRINGE FOR IV PUSH (FOR BLOOD PRESSURE SUPPORT)
80.0000 ug | PREFILLED_SYRINGE | INTRAVENOUS | Status: DC | PRN
  Filled 2011-05-01: qty 5

## 2011-05-01 MED ORDER — PRENATAL PLUS 27-1 MG PO TABS
1.0000 | ORAL_TABLET | Freq: Every day | ORAL | Status: DC
  Administered 2011-05-02: 1 via ORAL
  Filled 2011-05-01: qty 1

## 2011-05-01 MED ORDER — ZOLPIDEM TARTRATE 5 MG PO TABS: 5.0000 mg | ORAL_TABLET | Freq: Every evening | ORAL | Status: DC | PRN

## 2011-05-01 MED ORDER — FENTANYL 2.5 MCG/ML BUPIVACAINE 1/10 % EPIDURAL INFUSION (WH - ANES)
INTRAMUSCULAR | Status: DC | PRN
  Administered 2011-05-01: 15 mL/h via EPIDURAL

## 2011-05-01 MED ORDER — NALBUPHINE SYRINGE 5 MG/0.5 ML
5.0000 mg | INJECTION | INTRAMUSCULAR | Status: DC | PRN
  Administered 2011-05-01: 5 mg via INTRAVENOUS
  Filled 2011-05-01 (×2): qty 0.5

## 2011-05-01 MED ORDER — ONDANSETRON HCL 4 MG PO TABS: 4.0000 mg | ORAL_TABLET | ORAL | Status: DC | PRN

## 2011-05-01 MED ORDER — PHENYLEPHRINE 40 MCG/ML (10ML) SYRINGE FOR IV PUSH (FOR BLOOD PRESSURE SUPPORT): 80.0000 ug | PREFILLED_SYRINGE | INTRAVENOUS | Status: DC | PRN

## 2011-05-01 MED ORDER — TETANUS-DIPHTH-ACELL PERTUSSIS 5-2.5-18.5 LF-MCG/0.5 IM SUSP
0.5000 mL | Freq: Once | INTRAMUSCULAR | Status: AC
  Administered 2011-05-02: 0.5 mL via INTRAMUSCULAR

## 2011-05-01 MED ORDER — OXYCODONE-ACETAMINOPHEN 5-325 MG PO TABS
1.0000 | ORAL_TABLET | ORAL | Status: DC | PRN
  Administered 2011-05-01: 1 via ORAL
  Administered 2011-05-02: 2 via ORAL
  Administered 2011-05-02 (×2): 1 via ORAL
  Administered 2011-05-02: 2 via ORAL
  Administered 2011-05-02 (×2): 1 via ORAL
  Administered 2011-05-03 (×2): 2 via ORAL
  Filled 2011-05-01: qty 1
  Filled 2011-05-01: qty 2
  Filled 2011-05-01 (×3): qty 1
  Filled 2011-05-01 (×3): qty 2
  Filled 2011-05-01: qty 1

## 2011-05-01 MED ORDER — EPHEDRINE 5 MG/ML INJ
10.0000 mg | INTRAVENOUS | Status: DC | PRN
  Filled 2011-05-01: qty 4

## 2011-05-01 MED ORDER — OXYTOCIN 20 UNITS IN LACTATED RINGERS INFUSION - SIMPLE
1.0000 m[IU]/min | INTRAVENOUS | Status: DC
  Administered 2011-05-01: 2 m[IU]/min via INTRAVENOUS

## 2011-05-01 MED ORDER — EPHEDRINE 5 MG/ML INJ: 10.0000 mg | INTRAVENOUS | Status: DC | PRN

## 2011-05-01 MED ORDER — SENNOSIDES-DOCUSATE SODIUM 8.6-50 MG PO TABS
2.0000 | ORAL_TABLET | Freq: Every day | ORAL | Status: DC
  Administered 2011-05-01 – 2011-05-02 (×2): 2 via ORAL

## 2011-05-01 NOTE — Progress Notes (Signed)
DR. Cherly Hensen updated on pt status, fetal strip, and rate of pitocin. No new orders. To continue with POC

## 2011-05-01 NOTE — Progress Notes (Signed)
IUPC rezeroed, assessing intensity, resting and MVUs

## 2011-05-01 NOTE — Progress Notes (Signed)
Dr. Cherly Hensen updated on pt status, vital signs, fainting episodesx2. New orders for CBC

## 2011-05-01 NOTE — Progress Notes (Signed)
Dr. Cherly Hensen updated on pt temp

## 2011-05-01 NOTE — Progress Notes (Signed)
Practice push, pt to labor down

## 2011-05-01 NOTE — Progress Notes (Signed)
Ambulated patient onto the steady. Pt stated feel feels dizzy. Pt passed out for 10-15 seconds, smelling salt used, pt regained consciousness. Pt passed out x2. Smelling salt used, pt regained consciousness.. Staff called in for assistance. Pt assisted back into bed. LR@bolus , vital signs taken

## 2011-05-01 NOTE — Progress Notes (Signed)
S: Comfortable. Does not feel as much rectal pressure as before  O: fully dilated /+! Station  Tracing: baseline 150 small accels some short variable Ctx q 2-3 mins  Imp: Complete Postdate GBScx (+)   P) start pushing. Cont amnioinfusion

## 2011-05-01 NOTE — Progress Notes (Signed)
Assisted up to bathroom. Gait steady.

## 2011-05-01 NOTE — Progress Notes (Signed)
Dr. Cherly Hensen updated on pt status, to labor down and to push in 30 min.

## 2011-05-01 NOTE — Progress Notes (Deleted)
S: Sleepy. S/P cytotec x2  Pitocin  O: VE 4/80/-3 applied to cervix but  vtx off to right. palp bag. AROM med meconium   IUPC,  FSE applied  Tracing: baseline 130 reactive (+) accels. Ctx q ? 2-3 mins  IMP: Postdate GBS cx (+) on PCN   P) amnioinfusion 2) cont pitocin 3) Exaggerated left sims

## 2011-05-01 NOTE — Progress Notes (Signed)
Sheila Burns is a 35 y.o. G1P0 at [redacted]w[redacted]d by LMP admitted for induction of labor due to Post dates. Due date 11/12.  Subjective: No chief complaint on file.   Objective: BP 120/76  Pulse 86  Temp(Src) 98 F (36.7 C) (Oral)  Resp 18  Ht 5\' 7"  (1.702 m)  Wt 90.719 kg (200 lb)  BMI 31.32 kg/m2  LMP 07/18/2010  Breastfeeding? Unknown      FHT:  FHR: 120 bpm, variability: minimal ,  accelerations:  Abscent,  decelerations:  Absent UC:   irregular, every 5-6 minutes SVE:  Deferred with placement of 2nd cytotec Tracing: nR baseline 120  Labs: Lab Results  Component Value Date   WBC 13.7* 04/30/2011   HGB 12.8 04/30/2011   HCT 38.0 04/30/2011   MCV 91.1 04/30/2011   PLT 297 04/30/2011    Assessment / Plan: Postdates  P) start pitocin when next dose of cytotec  due Anticipated MOD:  ? vag delivery due to inability to tol vag exams  Bayne Fosnaugh A 05/01/2011, 1:06 AM

## 2011-05-01 NOTE — Progress Notes (Signed)
Pt. Felt faint in bathroom. Assisted to wheelchair. Cool cloth to forehead. Ammonia inhalant.

## 2011-05-01 NOTE — Anesthesia Procedure Notes (Signed)
Epidural Patient location during procedure: OB Start time: 05/01/2011 5:29 AM  Staffing Anesthesiologist: Jaxson Anglin A. Performed by: anesthesiologist   Preanesthetic Checklist Completed: patient identified, site marked, surgical consent, pre-op evaluation, timeout performed, IV checked, risks and benefits discussed and monitors and equipment checked  Epidural Patient position: sitting Prep: site prepped and draped and DuraPrep Patient monitoring: continuous pulse ox and blood pressure Approach: midline Injection technique: LOR air  Needle:  Needle type: Tuohy  Needle gauge: 17 G Needle length: 9 cm Needle insertion depth: 6 cm Catheter type: closed end flexible Catheter size: 19 Gauge Catheter at skin depth: 11 cm Test dose: negative and 1.5% lidocaine  Assessment Events: blood not aspirated, injection not painful, no injection resistance, negative IV test and no paresthesia  Additional Notes Patient is more comfortable after epidural dosed. Please see RN's note for documentation of vital signs and FHR which are stable.

## 2011-05-01 NOTE — Progress Notes (Addendum)
S: Called 2nd to fetal heart rate decelerationx 7 mins. On arrival FHR was up to 140 with subsequent  variable decel.  BP was normal. Maternal O2 in place. VE 4/70/-2 per RN intact membrane. Pitocin which was started 10 mins prior to decel was discontinued.   O: reviewed tracing: baseline 140 (+) accels to 150 ctx q 2-4 mins prior to Pitocin. Prior to pitocin . FHR min variability  with subtle decel down to 130 x 1 min x 3  Ctx ? Late. FHR decel down to 80"s  X 7 mins with slow return to baseline of 140  VE done: 4/60%/-3 AROM clear fluid. IUPC and ISE placed. LOT/LOA off centered  IMP: Fetal bradycardia w/o etiology. Reassuring tracing subsequently. Will do in utero resuscitation and then restart pticon. If fetal intol to pitocin, then pt and husband advised will need C/S  P) Exaggerated right sims. Cont mat O2. Restart pitocin after observation of tracing for a period of time. Amnioinfusion started for some variable decels with ctxs

## 2011-05-01 NOTE — Anesthesia Preprocedure Evaluation (Signed)
Anesthesia Evaluation  Patient identified by MRN, date of birth, ID band Patient awake    Reviewed: Allergy & Precautions, H&P , Patient's Chart, lab work & pertinent test results  Airway Mallampati: III TM Distance: >3 FB Neck ROM: full    Dental No notable dental hx. (+) Teeth Intact   Pulmonary neg pulmonary ROS,  clear to auscultation  Pulmonary exam normal       Cardiovascular neg cardio ROS regular Normal    Neuro/Psych  Headaches, PSYCHIATRIC DISORDERS    GI/Hepatic negative GI ROS, Neg liver ROS,   Endo/Other  Negative Endocrine ROS  Renal/GU negative Renal ROS  Genitourinary negative   Musculoskeletal   Abdominal   Peds  Hematology negative hematology ROS (+)   Anesthesia Other Findings   Reproductive/Obstetrics (+) Pregnancy                           Anesthesia Physical Anesthesia Plan  ASA: II  Anesthesia Plan: Epidural   Post-op Pain Management:    Induction:   Airway Management Planned:   Additional Equipment:   Intra-op Plan:   Post-operative Plan:   Informed Consent: I have reviewed the patients History and Physical, chart, labs and discussed the procedure including the risks, benefits and alternatives for the proposed anesthesia with the patient or authorized representative who has indicated his/her understanding and acceptance.     Plan Discussed with: Anesthesiologist, CRNA and Surgeon  Anesthesia Plan Comments:         Anesthesia Quick Evaluation

## 2011-05-01 NOTE — Progress Notes (Signed)
S: Comfortable. Pitocin restarted  O: VE deferred. T 99.6 Tracing: baseline 140 (+) accels mod variability Ctx q 2-4 mins (20- 40 MV units)  IMP: Reassuring fetal tracing GBS cx (+) on PCN Postdates P) cont present mgmt. Re-examine when contractions are adequate for at least 2 hours. Amnioinfusion ongoing

## 2011-05-01 NOTE — Progress Notes (Signed)
IUPC adjusted and re-zeroed. Assessing intensity and MVUs

## 2011-05-02 LAB — CBC
HCT: 25.8 % — ABNORMAL LOW (ref 36.0–46.0)
Hemoglobin: 8.7 g/dL — ABNORMAL LOW (ref 12.0–15.0)
WBC: 16.3 10*3/uL — ABNORMAL HIGH (ref 4.0–10.5)

## 2011-05-02 MED ORDER — BUPROPION HCL ER (XL) 150 MG PO TB24
150.0000 mg | ORAL_TABLET | Freq: Every day | ORAL | Status: DC
  Administered 2011-05-02 – 2011-05-03 (×2): 150 mg via ORAL
  Filled 2011-05-02 (×3): qty 1

## 2011-05-02 NOTE — Progress Notes (Signed)
  PPD 1 SVD  S:  Reports feeling well - bottom sore and swollen / ice helping             Tolerating po/ No nausea or vomiting             Bleeding is light             Pain controlled withprescription NSAID's including motrin and percocet             Up ad lib / ambulatory  Newborn breast feeding  / Circumcision planned today   O:  A & O x 3 NAD             VS: Blood pressure 111/72, pulse 84, temperature 97.8 F (36.6 C), temperature source Oral, resp. rate 18, height 5\' 7"  (1.702 m), weight 90.719 kg (200 lb), last menstrual period 07/18/2010, SpO2 96.00%, unknown if currently breastfeeding.  LABS: Lab Results  Component Value Date   WBC 16.3* 05/02/2011   HGB 8.7* 05/02/2011   HCT 25.8* 05/02/2011   MCV 92.5 05/02/2011   PLT 233 05/02/2011     Lungs: Clear and unlabored  Heart: regular rate and rhythm / no mumurs  Abdomen: soft, non-tender, non-distended              Fundus: firm, non-tender, U-1  Perineum: + edema / ice pack in place  Lochia: light  Extremities: trace edema, no calf pain or tenderness    A: PPD # 1              Acute blood loss anemia from childbirth  Doing well - stable status  P:  Routine post partum orders             Start iron and colace             Anticipate discharge tomorrow  Sheila Burns 05/02/2011, 9:16 AM

## 2011-05-02 NOTE — Progress Notes (Signed)

## 2011-05-02 NOTE — Anesthesia Postprocedure Evaluation (Signed)
  Anesthesia Post-op Note  Patient: Sheila Burns  Procedure(s) Performed: * No procedures listed *  Patient Location: Mother/Baby  Anesthesia Type: Epidural  Level of Consciousness: awake, alert  and oriented  Airway and Oxygen Therapy: Patient Spontanous Breathing  Post-op Pain: none  Post-op Assessment: Post-op Vital signs reviewed, Patient's Cardiovascular Status Stable, No headache, No backache, No residual numbness and No residual motor weakness  Post-op Vital Signs: Reviewed and stable  Complications: No apparent anesthesia complications

## 2011-05-03 MED ORDER — OXYCODONE-ACETAMINOPHEN 5-325 MG PO TABS: 1.0000 | ORAL_TABLET | ORAL | Status: AC | PRN

## 2011-05-03 MED ORDER — DOCUSATE SODIUM 100 MG PO CAPS: 100.0000 mg | ORAL_CAPSULE | Freq: Two times a day (BID) | ORAL | Status: AC

## 2011-05-03 MED ORDER — IBUPROFEN 800 MG PO TABS: 800.0000 mg | ORAL_TABLET | Freq: Four times a day (QID) | ORAL | Status: AC

## 2011-05-03 MED ORDER — POLYSACCHARIDE IRON COMPLEX 150 MG PO CAPS: 150.0000 mg | ORAL_CAPSULE | Freq: Every day | ORAL | Status: DC

## 2011-05-03 NOTE — Progress Notes (Signed)
Post Partum Day #2            Information for the patient's newborn:  Kennetha, Pearman [[id number]]  female   / circumcision done Feeding: breast, some pain with latching, lactation support given  Subjective: No HA, SOB, CP, F/C, breast symptoms. Pain better today, controlled w/ percocet and motrin. Normal vaginal bleeding, no clots.   Stable mood, social work consult done.    Objective:  Temp:  [97.4 F (36.3 C)-98 F (36.7 C)] 97.5 F (36.4 C) (11/20 0545) Pulse Rate:  [73-92] 73  (11/20 0545) Resp:  [18] 18  (11/20 0545) BP: (105-111)/(68-69) 107/69 mmHg (11/20 0545)  No intake or output data in the 24 hours ending 05/03/11 1009     Basename 05/02/11 0520 05/01/11 1857  WBC 16.3* 20.7*  HGB 8.7* 10.4*  HCT 25.8* 31.1*  PLT 233 253    Blood type: --/--/O POS (11/18 1851) Rubella: Immune (04/04 0000)    Physical Exam:  General: alert, cooperative and no distress Uterine Fundus: firm Lochia: appropriate Perineum: repair intact, edema mild DVT Evaluation: Negative Homan's sign. No significant calf/ankle edema.    Assessment/Plan: PPD # 2 / 35 y.o., G2P1001 S/P:spontaneous vaginal   Principal Problem:  *Postpartum care following vaginal delivery (11/18) Active Problems:  Depression  - stable, continue Wellbutrin ABL anemia  Start oral Fe and Colace normal postpartum exam  Continue current postpartum care  D/C home   LOS: 3 days   Fredrica Capano, CNM, MSN 05/03/2011, 10:09 AM

## 2011-05-03 NOTE — Discharge Summary (Signed)
Obstetric Discharge Summary Reason for Admission: induction of labor and post dates / large for gestational age Prenatal Procedures: NST and ultrasound Intrapartum Procedures: spontaneous vaginal delivery Postpartum Procedures: Tdap vaccine Complications-Operative and Postpartum: 2nd degree perineal laceration and Right anterior sulcus vaginal Hemoglobin  Date Value Range Status  05/02/2011 8.7* 12.0-15.0 (g/dL) Final     HCT  Date Value Range Status  05/02/2011 25.8* 36.0-46.0 (%) Final    Discharge Diagnoses: Term Pregnancy-delivered and depression  Acute blood loss anemia  Discharge Information: Date: 05/03/2011 Activity: pelvic rest Diet: routine Medications: PNV, Ibuprofen, Colace, Iron, Percocet and Wellbutrin Condition: stable Instructions: refer to practice specific booklet Discharge to: home Follow-up Information    Follow up with Sheila Burns,Sheila Burns in 6 weeks.   Contact information:   [address] [place] [address] [phone]          Newborn Data: Live born female "Sheila Burns" Birth Weight: 8 lb (3629 g) APGAR: 9, 9  Home with mother.  Burns,Sheila 05/03/2011, 10:18 AM

## 2011-06-17 ENCOUNTER — Ambulatory Visit (INDEPENDENT_AMBULATORY_CARE_PROVIDER_SITE_OTHER): Payer: 59 | Admitting: General Surgery

## 2011-06-17 VITALS — BP 112/82 | HR 84 | Temp 97.2°F | Resp 14 | Ht 67.0 in | Wt 183.0 lb

## 2011-06-17 DIAGNOSIS — O878 Other venous complications in the puerperium: Secondary | ICD-10-CM

## 2011-06-17 DIAGNOSIS — O872 Hemorrhoids in the puerperium: Secondary | ICD-10-CM | POA: Insufficient documentation

## 2011-06-17 MED ORDER — HYDROCORTISONE 2.5 % RE CREA: TOPICAL_CREAM | Freq: Two times a day (BID) | RECTAL | Status: AC

## 2011-06-17 NOTE — Progress Notes (Signed)
Chief Complaint  Patient presents with  . Other    hemorrhoids    HISTORY: Pt is 3 months post partum and has had issues with bleeding, and a small hemorrhoid that she can feel.  She was very constipated immediately post partum and took a lot of pain medication.  She is off the pain medication now, and her stools have gotten much better.  She is taking stool softeners and fiber supplements.  She has tried over the counter hemorrhoid medications and has had some improvement, but not relief.  She has bleeding when she wipes.    Past Medical History  Diagnosis Date  . Depression   . Dyspareunia   . Headache     miagraine  . History of chicken pox   . Vulvar vestibulitis   . Normal pregnancy (IOL / PD / LGA) 04/30/2011    No past surgical history on file.  Current Outpatient Prescriptions  Medication Sig Dispense Refill  . buPROPion (WELLBUTRIN XL) 150 MG 24 hr tablet Take 150 mg by mouth daily.        . hydrocortisone (ANUSOL-HC) 2.5 % rectal cream Place rectally 2 (two) times daily.  30 g  2  . iron polysaccharides (NU-IRON) 150 MG capsule Take 1 capsule (150 mg total) by mouth daily.  30 capsule  3  . prenatal vitamin w/FE, FA (PRENATAL 1 + 1) 27-1 MG TABS Take 1 tablet by mouth daily.           Allergies  Allergen Reactions  . Latex     ? sensitivity     Family History  Problem Relation Age of Onset  . Anesthesia problems Neg Hx   . Hypotension Neg Hx   . Malignant hyperthermia Neg Hx   . Pseudochol deficiency Neg Hx   . Peripheral vascular disease Mother   . Pulmonary fibrosis Father   . Depression Sister   . Depression Brother   . Cancer Paternal Uncle     lung  . Cancer Maternal Grandfather     prostate     History   Social History  . Marital Status: Married    Spouse Name: N/A    Number of Children: N/A  . Years of Education: N/A   Social History Main Topics  . Smoking status: Never Smoker   . Smokeless tobacco: Never Used  . Alcohol Use: No  .  Drug Use: No  . Sexually Active: Yes    REVIEW OF SYSTEMS - PERTINENT POSITIVES ONLY: 12 point review of systems negative other than HPI and PMH  EXAM: Filed Vitals:   06/17/11 1138  BP: 112/82  Pulse: 84  Temp: 97.2 F (36.2 C)  Resp: 14    Gen:  No acute distress.  Well nourished and well groomed.   Neurological: Alert and oriented to person, place, and time. Coordination normal.  Head: Normocephalic and atraumatic.  Eyes: Conjunctivae are normal. Pupils are equal, round, and reactive to light. No scleral icterus.   Cardiovascular: Normal rate and intact distal pulses.   Respiratory: Effort normal.  No respiratory distress. No chest wall tenderness. GI: Soft.   Rectal:  Small posterior external hemorrhoid.  Moderate sized internal hemorrhoid.   Musculoskeletal: Normal range of motion. Extremities are nontender.  Skin: Skin is warm and dry. No rash noted. No diaphoresis. No erythema. No pallor. No clubbing, cyanosis, or edema.   Psychiatric: Normal mood and affect. Behavior is normal. Judgment and thought content normal.     ASSESSMENT  AND PLAN: Hemorrhoids, postpartum Injected internal posterior hemorrhoid. Recommended sitz baths and anusol  Continue stool softeners, fiber supplements. Avoid constipation. Follow up in 8 weeks.       Maudry Diego MD Surgical Oncology, General and Endocrine Surgery [facility]      Visit Diagnoses: 1. Hemorrhoids, postpartum     Primary Care Physician: Cynda Familia, MD, MD

## 2011-06-17 NOTE — Patient Instructions (Signed)
Hemorrhoids  Hemorrhoids are enlarged (dilated) veins around the rectum. There are 2 types of hemorrhoids, and the type of hemorrhoid is determined by its location. Internal hemorrhoids occur in the veins just inside the rectum.They are usually not painful, but they may bleed.However, they may poke through to the outside and become irritated and painful. External hemorrhoids involve the veins outside the anus and can be felt as a painful swelling or hard lump near the anus.They are often itchy and may crack and bleed. Sometimes clots will form in the veins. This makes them swollen and painful. These are called thrombosed hemorrhoids. CAUSES Causes of hemorrhoids include:  Pregnancy. This increases the pressure in the hemorrhoidal veins.   Constipation.   Straining to have a bowel movement.   Obesity.   Heavy lifting or other activity that caused you to strain.   TREATMENT Most of the time hemorrhoids improve in 1 to 2 weeks. However, if symptoms do not seem to be getting better or if you have a lot of rectal bleeding, your caregiver may perform a procedure to help make the hemorrhoids get smaller or remove them completely.Possible treatments include:  Rubber band ligation. A rubber band is placed at the base of the hemorrhoid to cut off the circulation.   Sclerotherapy. A chemical is injected to shrink the hemorrhoid.   Infrared light therapy. Tools are used to burn the hemorrhoid.   Hemorrhoidectomy. This is surgical removal of the hemorrhoid.   HOME CARE INSTRUCTIONS   Increase fiber in your diet. Ask your caregiver about using fiber supplements.   Drink enough water and fluids to keep your urine clear or pale yellow.   Exercise regularly.   Go to the bathroom when you have the urge to have a bowel movement. Do not wait.   Avoid straining to have bowel movements.   Keep the anal area dry and clean.   Only take over-the-counter or prescription medicines for pain,  discomfort, or fever as directed by your caregiver.   Take warm sitz baths for 20 to 30 minutes, 3 to 4 times per day.   If the hemorrhoids are very tender and swollen, place ice packs on the area as tolerated. Using ice packs between sitz baths may be helpful. Fill a plastic bag with ice. Place a towel between the bag of ice and your skin.   Medicated creams and suppositories may be used or applied as directed.   Do not use a donut-shaped pillow or sit on the toilet for long periods. This increases blood pooling and pain.   SEEK MEDICAL CARE IF:   You have increasing pain and swelling that is not controlled with your medicine.   You have uncontrolled bleeding.   You have difficulty or you are unable to have a bowel movement.   You have pain or inflammation outside the area of the hemorrhoids.   You have chills or an oral temperature above 102 F (38.9 C).     

## 2011-06-17 NOTE — Assessment & Plan Note (Signed)
Injected internal posterior hemorrhoid. Recommended sitz baths and anusol  Continue stool softeners, fiber supplements. Avoid constipation. Follow up in 8 weeks.

## 2011-08-12 ENCOUNTER — Encounter (INDEPENDENT_AMBULATORY_CARE_PROVIDER_SITE_OTHER): Payer: 59 | Admitting: General Surgery

## 2012-05-22 LAB — OB RESULTS CONSOLE GC/CHLAMYDIA
Chlamydia: NEGATIVE
Gonorrhea: NEGATIVE

## 2012-05-22 LAB — OB RESULTS CONSOLE HIV ANTIBODY (ROUTINE TESTING): HIV: NONREACTIVE

## 2012-05-22 LAB — OB RESULTS CONSOLE RUBELLA ANTIBODY, IGM: Rubella: IMMUNE

## 2012-06-13 NOTE — L&D Delivery Note (Signed)
Delivery Note I was called to attend this delivery d/t precipitous labor.  Pt had SROM with clear fluid, and after 2 pushes, at 4:38 AM a viable female was delivered via Vaginal, Spontaneous Delivery (Presentation: LOA;  ).  APGAR: , ; weight pending. A loose nuchal cord was reduced, and the shoulders easily delivered.  A true knot was noted in the cord.  After the cord stopped pulsing, it was double clamped and cut.  At this time, Dr. Juliene Pina arrived and care was turned over to her.  Sheila Burns,Sheila Burns 12/28/2012, 4:51 AM  MD Delivery Note RN called me at about 2.25 am with 2nd Cytotec dose information and cx was 1 cm/ thick/ contractions not regular, requesting pain mngmt, Nubain given. Then called at 4.28 am with rapid labor progress and anterior lip, awaiting epidural. But patient had urge to push while I was en route and delivered NSVD with 2 pushes.  At 4:38 AM a viable and healthy female was delivered via Vaginal, Spontaneous Delivery (Presentation: Occiput Anterior) as above by [facility] CNM without complications.   APGAR: 8, 8; weight - pending Placenta status: Intact, Spontaneous.  Cord: 3 vessels with the following complications: None.  Cord pH: N/A.  Anesthesia: Local 1% Lidocaine infiltration for laceration Episiotomy: None Lacerations: 2nd degree Suture Repair: 3.0 vicryl rapide Est. Blood Loss (mL): 350 cc. Prior h/o PPH'hage, uterus firm and no active bleeding noted.   Mom to postpartum.  Baby to nursery-stable.  * NO GBS coverage since patient progressed rapidly with 2nd Cytotec.    Sheila Burns R 12/28/2012, 5:39 AM

## 2012-12-24 ENCOUNTER — Other Ambulatory Visit: Payer: Self-pay | Admitting: Obstetrics & Gynecology

## 2012-12-25 ENCOUNTER — Telehealth (HOSPITAL_COMMUNITY): Payer: Self-pay | Admitting: *Deleted

## 2012-12-25 ENCOUNTER — Encounter (HOSPITAL_COMMUNITY): Payer: Self-pay | Admitting: *Deleted

## 2012-12-25 NOTE — Telephone Encounter (Signed)
Preadmission screen  

## 2012-12-27 ENCOUNTER — Encounter (HOSPITAL_COMMUNITY): Payer: Self-pay

## 2012-12-27 ENCOUNTER — Inpatient Hospital Stay (HOSPITAL_COMMUNITY)
Admission: AD | Admit: 2012-12-27 | Discharge: 2012-12-29 | DRG: 373 | Disposition: A | Discharge status: Home / Self Care | Payer: BC Managed Care – PPO | Source: Ambulatory Visit | Attending: Obstetrics and Gynecology | Admitting: Obstetrics and Gynecology

## 2012-12-27 DIAGNOSIS — Z22330 Carrier of Group B streptococcus: Secondary | ICD-10-CM

## 2012-12-27 DIAGNOSIS — O9903 Anemia complicating the puerperium: Secondary | ICD-10-CM | POA: Diagnosis not present

## 2012-12-27 DIAGNOSIS — D62 Acute posthemorrhagic anemia: Secondary | ICD-10-CM | POA: Diagnosis not present

## 2012-12-27 DIAGNOSIS — O99344 Other mental disorders complicating childbirth: Secondary | ICD-10-CM | POA: Diagnosis present

## 2012-12-27 DIAGNOSIS — O09529 Supervision of elderly multigravida, unspecified trimester: Secondary | ICD-10-CM | POA: Diagnosis present

## 2012-12-27 DIAGNOSIS — F3289 Other specified depressive episodes: Secondary | ICD-10-CM | POA: Diagnosis present

## 2012-12-27 DIAGNOSIS — O99892 Other specified diseases and conditions complicating childbirth: Secondary | ICD-10-CM | POA: Diagnosis present

## 2012-12-27 DIAGNOSIS — F329 Major depressive disorder, single episode, unspecified: Secondary | ICD-10-CM | POA: Diagnosis present

## 2012-12-27 LAB — CBC
MCH: 28.8 pg (ref 26.0–34.0)
MCHC: 32.8 g/dL (ref 30.0–36.0)
MCV: 87.8 fL (ref 78.0–100.0)
Platelets: 317 10*3/uL (ref 150–400)

## 2012-12-27 MED ORDER — ZOLPIDEM TARTRATE 5 MG PO TABS: 5.0000 mg | ORAL_TABLET | Freq: Every evening | ORAL | Status: DC | PRN

## 2012-12-27 MED ORDER — IBUPROFEN 600 MG PO TABS
600.0000 mg | ORAL_TABLET | Freq: Four times a day (QID) | ORAL | Status: DC | PRN
  Administered 2012-12-28: 600 mg via ORAL
  Filled 2012-12-27: qty 1

## 2012-12-27 MED ORDER — OXYCODONE-ACETAMINOPHEN 5-325 MG PO TABS: 1.0000 | ORAL_TABLET | ORAL | Status: DC | PRN

## 2012-12-27 MED ORDER — LACTATED RINGERS IV SOLN
500.0000 mL | INTRAVENOUS | Status: DC | PRN
  Administered 2012-12-28: 500 mL via INTRAVENOUS

## 2012-12-27 MED ORDER — LACTATED RINGERS IV SOLN
INTRAVENOUS | Status: DC
  Administered 2012-12-27 – 2012-12-28 (×2): via INTRAVENOUS

## 2012-12-27 MED ORDER — LIDOCAINE HCL (PF) 1 % IJ SOLN
30.0000 mL | INTRAMUSCULAR | Status: DC | PRN
  Filled 2012-12-27 (×2): qty 30

## 2012-12-27 MED ORDER — TERBUTALINE SULFATE 1 MG/ML IJ SOLN: 0.2500 mg | Freq: Once | INTRAMUSCULAR | Status: AC | PRN

## 2012-12-27 MED ORDER — CITRIC ACID-SODIUM CITRATE 334-500 MG/5ML PO SOLN: 30.0000 mL | ORAL | Status: DC | PRN

## 2012-12-27 MED ORDER — ACETAMINOPHEN 325 MG PO TABS: 650.0000 mg | ORAL_TABLET | ORAL | Status: DC | PRN

## 2012-12-27 MED ORDER — ONDANSETRON HCL 4 MG/2ML IJ SOLN
4.0000 mg | Freq: Four times a day (QID) | INTRAMUSCULAR | Status: DC | PRN
  Administered 2012-12-28: 4 mg via INTRAVENOUS
  Filled 2012-12-27: qty 2

## 2012-12-27 MED ORDER — FLEET ENEMA 7-19 GM/118ML RE ENEM: 1.0000 | ENEMA | Freq: Every day | RECTAL | Status: DC | PRN

## 2012-12-27 MED ORDER — OXYTOCIN 40 UNITS IN LACTATED RINGERS INFUSION - SIMPLE MED
62.5000 mL/h | INTRAVENOUS | Status: DC
  Administered 2012-12-28: 62.5 mL/h via INTRAVENOUS
  Filled 2012-12-27: qty 1000

## 2012-12-27 MED ORDER — MISOPROSTOL 25 MCG QUARTER TABLET
25.0000 ug | ORAL_TABLET | ORAL | Status: DC | PRN
  Administered 2012-12-27 – 2012-12-28 (×2): 25 ug via VAGINAL
  Filled 2012-12-27 (×2): qty 0.25
  Filled 2012-12-27: qty 1

## 2012-12-27 MED ORDER — OXYTOCIN BOLUS FROM INFUSION
500.0000 mL | INTRAVENOUS | Status: DC
  Administered 2012-12-28: 500 mL via INTRAVENOUS

## 2012-12-28 ENCOUNTER — Encounter (HOSPITAL_COMMUNITY): Payer: Self-pay | Admitting: Anesthesiology

## 2012-12-28 ENCOUNTER — Inpatient Hospital Stay (HOSPITAL_COMMUNITY): Admission: RE | Admit: 2012-12-28 | Payer: 59 | Source: Ambulatory Visit

## 2012-12-28 ENCOUNTER — Inpatient Hospital Stay (HOSPITAL_COMMUNITY): Payer: BC Managed Care – PPO | Admitting: Anesthesiology

## 2012-12-28 ENCOUNTER — Encounter (HOSPITAL_COMMUNITY): Payer: Self-pay | Admitting: *Deleted

## 2012-12-28 LAB — RPR: RPR Ser Ql: NONREACTIVE

## 2012-12-28 MED ORDER — SIMETHICONE 80 MG PO CHEW: 80.0000 mg | CHEWABLE_TABLET | ORAL | Status: DC | PRN

## 2012-12-28 MED ORDER — ONDANSETRON HCL 4 MG/2ML IJ SOLN: 4.0000 mg | INTRAMUSCULAR | Status: DC | PRN

## 2012-12-28 MED ORDER — PRENATAL MULTIVITAMIN CH
1.0000 | ORAL_TABLET | Freq: Every day | ORAL | Status: DC
  Administered 2012-12-28 – 2012-12-29 (×2): 1 via ORAL
  Filled 2012-12-28 (×2): qty 1

## 2012-12-28 MED ORDER — PHENYLEPHRINE 40 MCG/ML (10ML) SYRINGE FOR IV PUSH (FOR BLOOD PRESSURE SUPPORT)
80.0000 ug | PREFILLED_SYRINGE | INTRAVENOUS | Status: DC | PRN
  Filled 2012-12-28: qty 2

## 2012-12-28 MED ORDER — TETANUS-DIPHTH-ACELL PERTUSSIS 5-2.5-18.5 LF-MCG/0.5 IM SUSP: 0.5000 mL | Freq: Once | INTRAMUSCULAR | Status: DC

## 2012-12-28 MED ORDER — FLUOXETINE HCL 20 MG PO TABS
20.0000 mg | ORAL_TABLET | Freq: Every day | ORAL | Status: DC
  Filled 2012-12-28: qty 1

## 2012-12-28 MED ORDER — FLUOXETINE HCL 20 MG PO CAPS
40.0000 mg | ORAL_CAPSULE | Freq: Every day | ORAL | Status: DC
  Administered 2012-12-28 – 2012-12-29 (×2): 40 mg via ORAL
  Filled 2012-12-28 (×3): qty 2

## 2012-12-28 MED ORDER — IBUPROFEN 600 MG PO TABS
600.0000 mg | ORAL_TABLET | Freq: Four times a day (QID) | ORAL | Status: DC
  Administered 2012-12-28 – 2012-12-29 (×6): 600 mg via ORAL
  Filled 2012-12-28 (×7): qty 1

## 2012-12-28 MED ORDER — NALBUPHINE SYRINGE 5 MG/0.5 ML
10.0000 mg | INJECTION | INTRAMUSCULAR | Status: DC | PRN
  Administered 2012-12-28: 10 mg via INTRAVENOUS
  Filled 2012-12-28 (×2): qty 1

## 2012-12-28 MED ORDER — FENTANYL 2.5 MCG/ML BUPIVACAINE 1/10 % EPIDURAL INFUSION (WH - ANES)
14.0000 mL/h | INTRAMUSCULAR | Status: DC | PRN
  Filled 2012-12-28: qty 125

## 2012-12-28 MED ORDER — PHENYLEPHRINE 40 MCG/ML (10ML) SYRINGE FOR IV PUSH (FOR BLOOD PRESSURE SUPPORT)
80.0000 ug | PREFILLED_SYRINGE | INTRAVENOUS | Status: DC | PRN
  Filled 2012-12-28: qty 5
  Filled 2012-12-28: qty 2

## 2012-12-28 MED ORDER — LACTATED RINGERS IV SOLN
500.0000 mL | Freq: Once | INTRAVENOUS | Status: AC
  Administered 2012-12-28: 500 mL via INTRAVENOUS

## 2012-12-28 MED ORDER — BENZOCAINE-MENTHOL 20-0.5 % EX AERO
1.0000 "application " | INHALATION_SPRAY | CUTANEOUS | Status: DC | PRN
  Filled 2012-12-28: qty 56

## 2012-12-28 MED ORDER — EPHEDRINE 5 MG/ML INJ
10.0000 mg | INTRAVENOUS | Status: DC | PRN
  Filled 2012-12-28: qty 2
  Filled 2012-12-28: qty 4

## 2012-12-28 MED ORDER — EPHEDRINE 5 MG/ML INJ
10.0000 mg | INTRAVENOUS | Status: DC | PRN
  Filled 2012-12-28: qty 2

## 2012-12-28 MED ORDER — ONDANSETRON HCL 4 MG PO TABS: 4.0000 mg | ORAL_TABLET | ORAL | Status: DC | PRN

## 2012-12-28 MED ORDER — SENNOSIDES-DOCUSATE SODIUM 8.6-50 MG PO TABS
2.0000 | ORAL_TABLET | Freq: Every day | ORAL | Status: DC
  Administered 2012-12-29: 2 via ORAL

## 2012-12-28 MED ORDER — DIPHENHYDRAMINE HCL 50 MG/ML IJ SOLN: 12.5000 mg | INTRAMUSCULAR | Status: DC | PRN

## 2012-12-28 MED ORDER — WITCH HAZEL-GLYCERIN EX PADS: 1.0000 "application " | MEDICATED_PAD | CUTANEOUS | Status: DC | PRN

## 2012-12-28 MED ORDER — LANOLIN HYDROUS EX OINT: TOPICAL_OINTMENT | CUTANEOUS | Status: DC | PRN

## 2012-12-28 MED ORDER — ARIPIPRAZOLE 5 MG PO TABS
2.5000 mg | ORAL_TABLET | Freq: Every day | ORAL | Status: DC
  Administered 2012-12-28 – 2012-12-29 (×2): 2.5 mg via ORAL
  Filled 2012-12-28 (×3): qty 1

## 2012-12-28 MED ORDER — DIPHENHYDRAMINE HCL 25 MG PO CAPS: 25.0000 mg | ORAL_CAPSULE | Freq: Four times a day (QID) | ORAL | Status: DC | PRN

## 2012-12-28 MED ORDER — ZOLPIDEM TARTRATE 5 MG PO TABS: 5.0000 mg | ORAL_TABLET | Freq: Every evening | ORAL | Status: DC | PRN

## 2012-12-28 MED ORDER — DIBUCAINE 1 % RE OINT: 1.0000 "application " | TOPICAL_OINTMENT | RECTAL | Status: DC | PRN

## 2012-12-28 MED ORDER — OXYCODONE-ACETAMINOPHEN 5-325 MG PO TABS
1.0000 | ORAL_TABLET | ORAL | Status: DC | PRN
  Administered 2012-12-29: 1 via ORAL
  Filled 2012-12-28: qty 1

## 2012-12-28 NOTE — Lactation Note (Addendum)
This note was copied from the chart of Boy Kaylianna Zanders. Lactation Consultation Note; Mom's choice to breast feed her infant noted on chart on admission 12/27/12 at 2007  Patient Name: Boy Heide Brossart [id number] Date: 12/28/2012 Reason for consult: Initial assessment   Maternal Data Formula Feeding for Exclusion: No Infant to breast within first hour of birth: Yes Does the patient have breastfeeding experience prior to this delivery?: Yes  Feeding   LATCH Score/Interventions                      Lactation Tools Discussed/Used     Consult Status Consult Status: PRN  Experienced BF mom reports that baby has been breast feeding well. NT getting ready to do bath at this time. No questions at present. To call for assist prn. Bf brochure given with resources for support after DC.  Pamelia Hoit 12/28/2012, 12:03 PM

## 2012-12-28 NOTE — Progress Notes (Signed)

## 2012-12-28 NOTE — Anesthesia Preprocedure Evaluation (Deleted)

## 2012-12-28 NOTE — H&P (Signed)
Sheila Burns is a 37 y.o. female presenting for labor IOL at 40.5 wks. G2P1001, prior term NSVD after IOL at 41 wks.   PNC- [facility], Dr Juliene Pina GBS(+), Depression and Anxiety on Fluoxetine 60mg  and Abilify 10 mg (both cut to 1/2 dose at 36 wks). Initial placenta previa resolved. Normal anatomy ultrasounds. POH- Term SVD, PPH/hage.  History OB History   Grav Para Term Preterm Abortions TAB SAB Ect Mult Living   2 2 2       2      Past Medical History  Diagnosis Date  . Depression   . Dyspareunia   . Headache(784.0)     miagraine  . History of chicken pox   . Vulvar vestibulitis   . Normal pregnancy (IOL / PD / LGA) 04/30/2011  . Anemia   . Anxiety   . Cystitis, unspecified   . Unspecified hemorrhoids without mention of complication    Past Surgical History  Procedure Laterality Date  . No past surgeries     Family History: family history includes Cancer in her maternal grandfather and paternal uncle; Depression in her brother and sister; Peripheral vascular disease in her mother; and Pulmonary fibrosis in her father.  There is no history of Anesthesia problems, and Hypotension, and Malignant hyperthermia, and Pseudochol deficiency, . Social History:  reports that she has never smoked. She has never used smokeless tobacco. She reports that she does not drink alcohol or use illicit drugs.   Prenatal Transfer Tool  Maternal Diabetes: No Genetic Screening: Normal Ultrascreen neg and AFP 1 nl  Maternal Ultrasounds/Referrals: Normal Fetal Ultrasounds or other Referrals:  None Maternal Substance Abuse:  No Significant Maternal Medications:  Fluoxetine (40 mg in 1st trim, then 60 mg most preg, then cut it to 1/2 from 36 wks), Abilify (5 mg 1st trim, then 10 mg most preg, then cut to 1/2 at 36 wks) Significant Maternal Lab Results:  Lab values include: Group B Strep positive Other Comments:  None  ROS Exam Physical Exam  A&O x 3, no acute distress. Pleasant HEENT neg, no  thyromegaly Lungs CTA bilat CV RRR, S1S2 normal Abdo soft, non tender, non acute Extr no edema/ tenderness Pelvic f.tip cx, uneffaced, station -3/VTX FHT  140s/ + accels/ no decels/ mod variab- reassuring category I  Toco none  Prenatal labs: ABO, Rh: O/Positive/-- (12/10 0000) Antibody: Negative (12/10 0000) Rubella: Immune (12/10 0000) RPR: NON REACTIVE (07/17 2010)  HBsAg: Negative (12/10 0000)  HIV: Non-reactive (12/10 0000)  GBS: Positive (12/10 0000)  GLucola nl Anatomy sono nl  Assessment/Plan: 37 yo, AMA, G2P1001, 40.5 wks for labor IOL. GBS(+), PCN per protocol. Wants epidural Admit, Cytotec per protocol, pitocin in AM. EFW 7 lbs Depression/ Anxiety, pelvic pain. Will continue her meds PP.  V.Cruz Bong, MD

## 2012-12-29 LAB — CBC
MCHC: 32.7 g/dL (ref 30.0–36.0)
RDW: 16 % — ABNORMAL HIGH (ref 11.5–15.5)
WBC: 9.1 10*3/uL (ref 4.0–10.5)

## 2012-12-29 MED ORDER — IBUPROFEN 600 MG PO TABS: 600.0000 mg | ORAL_TABLET | Freq: Four times a day (QID) | ORAL | Status: DC

## 2012-12-29 MED ORDER — OXYCODONE-ACETAMINOPHEN 5-325 MG PO TABS: 1.0000 | ORAL_TABLET | ORAL | Status: DC | PRN

## 2012-12-29 NOTE — Discharge Summary (Signed)
Obstetric Discharge Summary Reason for Admission: induction of labor Prenatal Procedures: none Intrapartum Procedures: cytotec induction, SVD Postpartum Procedures: none Complications-Operative and Postpartum: 2nd degree perineal laceration, acute blood loss anemia Hemoglobin  Date Value Range Status  12/29/2012 9.8* 12.0 - 15.0 g/dL Final     HCT  Date Value Range Status  12/29/2012 30.0* 36.0 - 46.0 % Final    Physical Exam:  General: alert and cooperative Lochia: appropriate Uterine Fundus: firm Incision: laceration; no evidence of infection, redness, or edema DVT Evaluation: No evidence of DVT seen on physical exam. Negative Homan's sign. No cords or calf tenderness.  Discharge Diagnoses: Term Pregnancy-delivered, Acute Blood Loss Anemia  Discharge Information: Date: 12/29/2012 Activity: pelvic rest Diet: routine Medications: PNV, Ibuprofen, Percocet and Prozac, Abilify, Fe Condition: stable Instructions: refer to practice specific booklet Discharge to: home Follow-up Information   Follow up with MODY,VAISHALI R, MD. Schedule an appointment as soon as possible for a visit in 6 weeks. (2 weeks-for evaluation)    Contact information:   [address] [place] [address] [phone]       Newborn Data: Live born female on 12/28/2012 Birth Weight: 8 lb 0.4 oz (3640 g) APGAR: 8, 8  Home with mother.  Camy Leder, N 12/29/2012, 11:50 AM

## 2012-12-29 NOTE — Progress Notes (Signed)
Patient had a order to discharge home that was placed on the previous shift, but patient was not discharged by her previous RN per order.  Midwife on call notified and originally the discharge was cancelled, then she called back and gave an order to discharge patient.  Discharge paper work reviewed and patient has prescriptions.  Pt denies having any questions or concerns.

## 2012-12-29 NOTE — Progress Notes (Signed)
PPD #1- SVD  Subjective:   Reports feeling good, teary, having some soreness with breastfeeding Tolerating po/ No nausea or vomiting Bleeding is light Pain controlled withIbuprofen and Percocet Up ad lib / ambulatory / voiding without problems Newborn breastfeeding  / Circumcision today by Dr. Juliene Pina   Objective:   VS: BP 105/68  Pulse 56  Temp(Src) 97.6 F (36.4 C) (Oral)  Resp 18  Ht 5\' 7"  (1.702 m)  Wt 98.884 kg (218 lb)  BMI 34.14 kg/m2  SpO2 98%  LMP 03/17/2012 LABS:  Recent Labs  12/27/12 2010 12/29/12 0605  WBC 10.4 9.1  HGB 11.3* 9.8*  PLT 317 241               Blood type: O/Positive/-- (12/10 0000)  Rubella: Immune (12/10 0000)   I&O: Intake/Output     07/18 0701 - 07/19 0700 07/19 0701 - 07/20 0700   Blood     Total Output       Net              Physical Exam: Alert and oriented x3 Abdomen: soft, non-tender, non-distended  Fundus: firm, non-tender, U-1 Perineum: sutures approximated, no edema, redness, or drainage Lochia: small Extremities: trace edema; lower extremeties, no calf pain or tenderness    Assessment:  PPD # 1G2P2002/ S/P:induced vaginal, 2nd degree laceration  Acute blood loss anemia Doing well, stable for discharge later    Plan: Routine postpartum care Lactation to assist w/breastfeeding prior to d/c Discharge home  Follow-up 2-3 weeks to evaluate mood Routine postpartum visit 6 weeks RX's Ibuprofen 600mg  po every 6 hours as needed for pain Percocet 5/325 1 by mouth every 4 hours as needed for pain Continue Prozac, Abilify, & Fe   Kolbee Bogusz, N MSN, CNM 12/29/2012, 11:29 AM

## 2012-12-30 ENCOUNTER — Ambulatory Visit: Payer: Self-pay

## 2012-12-30 NOTE — Lactation Note (Signed)
This note was copied from the chart of Sheila Burns. Lactation Consultation Note  Patient Name: Sheila Saharah Sherrow [id number] Date: 12/30/2012 Reason for consult: Follow-up assessment   Maternal Data    Feeding Feeding Type: Breast Milk Feeding method: Breast Length of feed: 20 min  LATCH Score/Interventions Latch: Grasps breast easily, tongue down, lips flanged, rhythmical sucking. Intervention(s): Adjust position;Assist with latch;Breast massage;Breast compression  Audible Swallowing: A few with stimulation Intervention(s): Skin to skin;Hand expression Intervention(s): Skin to skin;Hand expression  Type of Nipple: Flat  Comfort (Breast/Nipple): Soft / non-tender  Interventions (Mild/moderate discomfort): Hand massage;Pre-pump if needed  Hold (Positioning): Assistance needed to correctly position infant at breast and maintain latch. Intervention(s): Breastfeeding basics reviewed;Support Pillows;Position options;Skin to skin  LATCH Score: 7  Lactation Tools Discussed/Used     Consult Status Consult Status: Complete  MD wants to supplement due to weight loss. Assisted mom with feeding tube and syringe to supplement at breast.. Baby took 10 cc's of formula. Parents verbalize understanding of use and cleaning. Dad assisted mom with syringe and feeding tube. No questions at present. Will see Rona Ravens tomorrow. To call us prn.  Pamelia Hoit 12/30/2012, 10:53 AM

## 2013-03-07 ENCOUNTER — Emergency Department (INDEPENDENT_AMBULATORY_CARE_PROVIDER_SITE_OTHER)
Admission: EM | Admit: 2013-03-07 | Discharge: 2013-03-07 | Disposition: A | Discharge status: Home / Self Care | Payer: BC Managed Care – PPO | Source: Home / Self Care | Attending: Emergency Medicine | Admitting: Emergency Medicine

## 2013-03-07 ENCOUNTER — Encounter: Payer: Self-pay | Admitting: Emergency Medicine

## 2013-03-07 DIAGNOSIS — J069 Acute upper respiratory infection, unspecified: Secondary | ICD-10-CM

## 2013-03-07 MED ORDER — AMOXICILLIN-POT CLAVULANATE 875-125 MG PO TABS: 1.0000 | ORAL_TABLET | Freq: Two times a day (BID) | ORAL | Status: DC

## 2013-03-07 NOTE — ED Provider Notes (Signed)
CSN: [id number]     Arrival date & time 03/07/13  1246 History   First MD Initiated Contact with Patient 03/07/13 1247     Chief Complaint  Patient presents with  . Facial Pain  . Cough   (Consider location/radiation/quality/duration/timing/severity/associated sxs/prior Treatment) HPI Sheila Burns is a 37 y.o. female who complains of onset of cold symptoms for a few weeks.  The symptoms are constant and mild-moderate in severity. No sore throat + cough (improving) No pleuritic pain No wheezing + nasal congestion + post-nasal drainage + L>R sinus pain/pressure No chest congestion No itchy/red eyes No earache No hemoptysis No SOB No chills/sweats No fever No nausea No vomiting No abdominal pain No diarrhea No skin rashes No fatigue No myalgias + headache     Past Medical History  Diagnosis Date  . Depression   . Dyspareunia   . Headache(784.0)     miagraine  . History of chicken pox   . Vulvar vestibulitis   . Normal pregnancy (IOL / PD / LGA) 04/30/2011  . Anemia   . Anxiety   . Cystitis, unspecified   . Unspecified hemorrhoids without mention of complication    Past Surgical History  Procedure Laterality Date  . No past surgeries     Family History  Problem Relation Age of Onset  . Anesthesia problems Neg Hx   . Hypotension Neg Hx   . Malignant hyperthermia Neg Hx   . Pseudochol deficiency Neg Hx   . Peripheral vascular disease Mother   . Pulmonary fibrosis Father   . Depression Sister   . Depression Brother   . Cancer Paternal Uncle     lung  . Cancer Maternal Grandfather     prostate   History  Substance Use Topics  . Smoking status: Never Smoker   . Smokeless tobacco: Never Used  . Alcohol Use: No   OB History   Grav Para Term Preterm Abortions TAB SAB Ect Mult Living   2 2 2       2      Review of Systems  All other systems reviewed and are negative.    Allergies  Latex  Home Medications   Current Outpatient Rx  Name  Route  Sig   Dispense  Refill  . levonorgestrel-ethinyl estradiol (ENPRESSE,TRIVORA) tablet   Oral   Take 1 tablet by mouth daily.         Marland Kitchen amoxicillin-clavulanate (AUGMENTIN) 875-125 MG per tablet   Oral   Take 1 tablet by mouth 2 (two) times daily.   16 tablet   0   . ARIPiprazole (ABILIFY) 5 MG tablet   Oral   Take 2.5 mg by mouth daily.         Marland Kitchen FLUoxetine (PROZAC) 20 MG tablet   Oral   Take 20 mg by mouth daily.         Marland Kitchen ibuprofen (ADVIL,MOTRIN) 600 MG tablet   Oral   Take 1 tablet (600 mg total) by mouth every 6 (six) hours.   30 tablet   0   . EXPIRED: iron polysaccharides (NU-IRON) 150 MG capsule   Oral   Take 1 capsule (150 mg total) by mouth daily.   30 capsule   3   . oxyCODONE-acetaminophen (PERCOCET/ROXICET) 5-325 MG per tablet   Oral   Take 1-2 tablets by mouth every 4 (four) hours as needed.   30 tablet   0   . prenatal vitamin w/FE, FA (PRENATAL 1 + 1) 27-1 MG TABS  Oral   Take 1 tablet by mouth daily.            BP 121/72  Pulse 60  Temp(Src) 97.6 F (36.4 C) (Oral)  Resp 16  Ht 5\' 7"  (1.702 m)  Wt 195 lb (88.451 kg)  BMI 30.53 kg/m2  SpO2 98% Physical Exam  Nursing note and vitals reviewed. Constitutional: She is oriented to person, place, and time. She appears well-developed and well-nourished.  HENT:  Head: Normocephalic and atraumatic.  Right Ear: Tympanic membrane, external ear and ear canal normal.  Left Ear: Tympanic membrane, external ear and ear canal normal.  Nose: Mucosal edema and rhinorrhea present.  Mouth/Throat: Posterior oropharyngeal erythema present. No oropharyngeal exudate or posterior oropharyngeal edema.  Eyes: No scleral icterus.  Neck: Neck supple.  Cardiovascular: Regular rhythm and normal heart sounds.   Pulmonary/Chest: Effort normal and breath sounds normal. No respiratory distress.  Neurological: She is alert and oriented to person, place, and time.  Skin: Skin is warm and dry.  Psychiatric: She has a  normal mood and affect. Her speech is normal.    ED Course  Procedures (including critical care time) Labs Review Labs Reviewed - No data to display Imaging Review No results found.  MDM   1. Acute upper respiratory infections of unspecified site    1)  Take the prescribed antibiotic as instructed. 2)  Use nasal saline solution (over the counter) at least 3 times a day. 3)  Use over the counter decongestants like Zyrtec-D every 12 hours as needed to help with congestion.  If you have hypertension, do not take medicines with sudafed.  4)  Can take tylenol every 6 hours or motrin every 8 hours for pain or fever. 5)  Follow up with your primary doctor if no improvement in 5-7 days, sooner if increasing pain, fever, or new symptoms.     Marlaine Hind, MD 03/07/13 1309

## 2013-03-07 NOTE — ED Notes (Signed)
Patient reports congestion, cough and sinus pain.

## 2013-03-16 ENCOUNTER — Telehealth: Payer: Self-pay | Admitting: Family Medicine

## 2013-07-09 LAB — OB RESULTS CONSOLE GC/CHLAMYDIA: Chlamydia: NEGATIVE

## 2014-03-23 LAB — OB RESULTS CONSOLE RPR: RPR: NONREACTIVE

## 2014-03-23 LAB — OB RESULTS CONSOLE ANTIBODY SCREEN: Antibody Screen: NEGATIVE

## 2014-03-23 LAB — OB RESULTS CONSOLE ABO/RH: RH Type: POSITIVE

## 2014-03-23 LAB — OB RESULTS CONSOLE HEPATITIS B SURFACE ANTIGEN: Hepatitis B Surface Ag: NEGATIVE

## 2014-03-23 LAB — OB RESULTS CONSOLE HIV ANTIBODY (ROUTINE TESTING): HIV: NONREACTIVE

## 2014-03-23 LAB — OB RESULTS CONSOLE RUBELLA ANTIBODY, IGM: RUBELLA: IMMUNE

## 2014-04-08 LAB — OB RESULTS CONSOLE GC/CHLAMYDIA
CHLAMYDIA, DNA PROBE: NEGATIVE
Gonorrhea: NEGATIVE

## 2014-04-14 ENCOUNTER — Encounter: Payer: Self-pay | Admitting: Emergency Medicine

## 2014-06-13 NOTE — L&D Delivery Note (Signed)
Delivery Note At 3:01 PM a viable and healthy female was delivered via Vaginal, Spontaneous Delivery (Presentation: Left Occiput Posterior).  APGAR: 8, 9; weight  pending.   Placenta status: Intact, Spontaneous.  Cord: 3 vessels with the following complications: None Long.  Cord pH: none  Anesthesia: Epidural  Episiotomy: None Lacerations: 1st degree;Perineal Suture Repair: 3.0 chromic Est. Blood Loss (mL):    Mom to postpartum.  Baby to Couplet care / Skin to Skin.  Sophronia Varney A 11/01/2014, 3:46 PM

## 2014-10-08 LAB — OB RESULTS CONSOLE GBS: GBS: POSITIVE

## 2014-11-01 ENCOUNTER — Inpatient Hospital Stay (HOSPITAL_COMMUNITY)
Admission: AD | Admit: 2014-11-01 | Discharge: 2014-11-02 | DRG: 775 | Disposition: A | Discharge status: Home / Self Care | Payer: 59 | Source: Ambulatory Visit | Attending: Obstetrics and Gynecology | Admitting: Obstetrics and Gynecology

## 2014-11-01 ENCOUNTER — Inpatient Hospital Stay (HOSPITAL_COMMUNITY): Payer: 59 | Admitting: Anesthesiology

## 2014-11-01 ENCOUNTER — Encounter (HOSPITAL_COMMUNITY): Payer: Self-pay

## 2014-11-01 DIAGNOSIS — O99824 Streptococcus B carrier state complicating childbirth: Secondary | ICD-10-CM | POA: Diagnosis present

## 2014-11-01 DIAGNOSIS — O9902 Anemia complicating childbirth: Secondary | ICD-10-CM | POA: Diagnosis present

## 2014-11-01 DIAGNOSIS — Z8249 Family history of ischemic heart disease and other diseases of the circulatory system: Secondary | ICD-10-CM | POA: Diagnosis not present

## 2014-11-01 DIAGNOSIS — Z3A39 39 weeks gestation of pregnancy: Secondary | ICD-10-CM | POA: Diagnosis present

## 2014-11-01 DIAGNOSIS — D62 Acute posthemorrhagic anemia: Secondary | ICD-10-CM | POA: Diagnosis present

## 2014-11-01 DIAGNOSIS — D509 Iron deficiency anemia, unspecified: Secondary | ICD-10-CM | POA: Diagnosis present

## 2014-11-01 DIAGNOSIS — O09523 Supervision of elderly multigravida, third trimester: Secondary | ICD-10-CM | POA: Diagnosis not present

## 2014-11-01 DIAGNOSIS — O99019 Anemia complicating pregnancy, unspecified trimester: Secondary | ICD-10-CM

## 2014-11-01 HISTORY — DX: Iron deficiency anemia, unspecified: D50.9

## 2014-11-01 HISTORY — DX: Anemia complicating pregnancy, unspecified trimester: O99.019

## 2014-11-01 HISTORY — DX: Acute posthemorrhagic anemia: D62

## 2014-11-01 LAB — TYPE AND SCREEN
ABO/RH(D): O POS
Antibody Screen: NEGATIVE

## 2014-11-01 LAB — CBC
HCT: 31.9 % — ABNORMAL LOW (ref 36.0–46.0)
HEMOGLOBIN: 10.3 g/dL — AB (ref 12.0–15.0)
MCH: 25.9 pg — AB (ref 26.0–34.0)
MCHC: 32.3 g/dL (ref 30.0–36.0)
MCV: 80.2 fL (ref 78.0–100.0)
Platelets: 365 10*3/uL (ref 150–400)
RBC: 3.98 MIL/uL (ref 3.87–5.11)
RDW: 15.5 % (ref 11.5–15.5)
WBC: 12 10*3/uL — ABNORMAL HIGH (ref 4.0–10.5)

## 2014-11-01 LAB — RPR: RPR: NONREACTIVE

## 2014-11-01 MED ORDER — LIDOCAINE HCL (PF) 1 % IJ SOLN
30.0000 mL | INTRAMUSCULAR | Status: DC | PRN
  Administered 2014-11-01: 30 mL via SUBCUTANEOUS
  Filled 2014-11-01: qty 30

## 2014-11-01 MED ORDER — DIBUCAINE 1 % RE OINT: 1.0000 "application " | TOPICAL_OINTMENT | RECTAL | Status: DC | PRN

## 2014-11-01 MED ORDER — EPHEDRINE 5 MG/ML INJ: 10.0000 mg | INTRAVENOUS | Status: DC | PRN

## 2014-11-01 MED ORDER — ZOLPIDEM TARTRATE 5 MG PO TABS: 5.0000 mg | ORAL_TABLET | Freq: Every evening | ORAL | Status: DC | PRN

## 2014-11-01 MED ORDER — IBUPROFEN 600 MG PO TABS
600.0000 mg | ORAL_TABLET | Freq: Four times a day (QID) | ORAL | Status: DC
  Administered 2014-11-01 – 2014-11-02 (×4): 600 mg via ORAL
  Filled 2014-11-01 (×3): qty 1

## 2014-11-01 MED ORDER — OXYTOCIN 40 UNITS IN LACTATED RINGERS INFUSION - SIMPLE MED
1.0000 m[IU]/min | INTRAVENOUS | Status: DC
  Administered 2014-11-01: 6 m[IU]/min via INTRAVENOUS
  Administered 2014-11-01: 2 m[IU]/min via INTRAVENOUS
  Filled 2014-11-01: qty 1000

## 2014-11-01 MED ORDER — WITCH HAZEL-GLYCERIN EX PADS
1.0000 "application " | MEDICATED_PAD | CUTANEOUS | Status: DC | PRN
  Administered 2014-11-01: 1 via TOPICAL

## 2014-11-01 MED ORDER — FENTANYL 2.5 MCG/ML BUPIVACAINE 1/10 % EPIDURAL INFUSION (WH - ANES)
14.0000 mL/h | INTRAMUSCULAR | Status: DC | PRN
  Administered 2014-11-01 (×2): 14 mL/h via EPIDURAL
  Filled 2014-11-01: qty 125

## 2014-11-01 MED ORDER — ACETAMINOPHEN 325 MG PO TABS
650.0000 mg | ORAL_TABLET | ORAL | Status: DC | PRN
  Administered 2014-11-01 (×2): 650 mg via ORAL
  Filled 2014-11-01 (×2): qty 2

## 2014-11-01 MED ORDER — DIPHENHYDRAMINE HCL 25 MG PO CAPS: 25.0000 mg | ORAL_CAPSULE | Freq: Four times a day (QID) | ORAL | Status: DC | PRN

## 2014-11-01 MED ORDER — ONDANSETRON HCL 4 MG PO TABS: 4.0000 mg | ORAL_TABLET | ORAL | Status: DC | PRN

## 2014-11-01 MED ORDER — ACETAMINOPHEN 325 MG PO TABS: 650.0000 mg | ORAL_TABLET | ORAL | Status: DC | PRN

## 2014-11-01 MED ORDER — SIMETHICONE 80 MG PO CHEW: 80.0000 mg | CHEWABLE_TABLET | ORAL | Status: DC | PRN

## 2014-11-01 MED ORDER — FLEET ENEMA 7-19 GM/118ML RE ENEM: 1.0000 | ENEMA | RECTAL | Status: DC | PRN

## 2014-11-01 MED ORDER — PENICILLIN G POTASSIUM 5000000 UNITS IJ SOLR
5.0000 10*6.[IU] | Freq: Once | INTRAMUSCULAR | Status: AC
  Administered 2014-11-01: 5 10*6.[IU] via INTRAVENOUS
  Filled 2014-11-01: qty 5

## 2014-11-01 MED ORDER — LACTATED RINGERS IV SOLN
INTRAVENOUS | Status: DC
  Administered 2014-11-01: 10:00:00 via INTRAVENOUS

## 2014-11-01 MED ORDER — OXYCODONE-ACETAMINOPHEN 5-325 MG PO TABS: 1.0000 | ORAL_TABLET | ORAL | Status: DC | PRN

## 2014-11-01 MED ORDER — OXYTOCIN BOLUS FROM INFUSION
500.0000 mL | INTRAVENOUS | Status: DC
  Administered 2014-11-01: 500 mL via INTRAVENOUS

## 2014-11-01 MED ORDER — LACTATED RINGERS IV SOLN
500.0000 mL | INTRAVENOUS | Status: DC | PRN
  Administered 2014-11-01: 1000 mL via INTRAVENOUS
  Administered 2014-11-01 (×3): 500 mL via INTRAVENOUS

## 2014-11-01 MED ORDER — DIPHENHYDRAMINE HCL 50 MG/ML IJ SOLN: 12.5000 mg | INTRAMUSCULAR | Status: DC | PRN

## 2014-11-01 MED ORDER — PHENYLEPHRINE 40 MCG/ML (10ML) SYRINGE FOR IV PUSH (FOR BLOOD PRESSURE SUPPORT): 80.0000 ug | PREFILLED_SYRINGE | INTRAVENOUS | Status: DC | PRN

## 2014-11-01 MED ORDER — OXYTOCIN 40 UNITS IN LACTATED RINGERS INFUSION - SIMPLE MED
62.5000 mL/h | INTRAVENOUS | Status: DC
  Administered 2014-11-01: 62.5 mL/h via INTRAVENOUS

## 2014-11-01 MED ORDER — OXYCODONE-ACETAMINOPHEN 5-325 MG PO TABS: 2.0000 | ORAL_TABLET | ORAL | Status: DC | PRN

## 2014-11-01 MED ORDER — PENICILLIN G POTASSIUM 5000000 UNITS IJ SOLR
2.5000 10*6.[IU] | INTRAVENOUS | Status: DC
  Administered 2014-11-01 (×2): 2.5 10*6.[IU] via INTRAVENOUS
  Filled 2014-11-01 (×5): qty 2.5

## 2014-11-01 MED ORDER — ONDANSETRON HCL 4 MG/2ML IJ SOLN: 4.0000 mg | INTRAMUSCULAR | Status: DC | PRN

## 2014-11-01 MED ORDER — TERBUTALINE SULFATE 1 MG/ML IJ SOLN
0.2500 mg | Freq: Once | INTRAMUSCULAR | Status: DC | PRN
  Filled 2014-11-01: qty 1

## 2014-11-01 MED ORDER — LACTATED RINGERS IV SOLN
INTRAVENOUS | Status: DC
  Administered 2014-11-01: 12:00:00 via INTRAUTERINE

## 2014-11-01 MED ORDER — ONDANSETRON HCL 4 MG/2ML IJ SOLN: 4.0000 mg | Freq: Four times a day (QID) | INTRAMUSCULAR | Status: DC | PRN

## 2014-11-01 MED ORDER — LANOLIN HYDROUS EX OINT: TOPICAL_OINTMENT | CUTANEOUS | Status: DC | PRN

## 2014-11-01 MED ORDER — CITRIC ACID-SODIUM CITRATE 334-500 MG/5ML PO SOLN
30.0000 mL | ORAL | Status: DC | PRN
  Filled 2014-11-01: qty 15

## 2014-11-01 MED ORDER — SENNOSIDES-DOCUSATE SODIUM 8.6-50 MG PO TABS
2.0000 | ORAL_TABLET | ORAL | Status: DC
  Administered 2014-11-01: 2 via ORAL
  Filled 2014-11-01: qty 2

## 2014-11-01 MED ORDER — FENTANYL 2.5 MCG/ML BUPIVACAINE 1/10 % EPIDURAL INFUSION (WH - ANES): 14.0000 mL/h | INTRAMUSCULAR | Status: DC | PRN

## 2014-11-01 MED ORDER — FERROUS SULFATE 325 (65 FE) MG PO TABS
325.0000 mg | ORAL_TABLET | Freq: Two times a day (BID) | ORAL | Status: DC
  Administered 2014-11-02: 325 mg via ORAL
  Filled 2014-11-01: qty 1

## 2014-11-01 MED ORDER — PHENYLEPHRINE 40 MCG/ML (10ML) SYRINGE FOR IV PUSH (FOR BLOOD PRESSURE SUPPORT)
80.0000 ug | PREFILLED_SYRINGE | INTRAVENOUS | Status: DC | PRN
  Administered 2014-11-01 (×2): 80 ug via INTRAVENOUS
  Filled 2014-11-01: qty 2
  Filled 2014-11-01: qty 20

## 2014-11-01 MED ORDER — EPHEDRINE 5 MG/ML INJ
10.0000 mg | INTRAVENOUS | Status: DC | PRN
  Administered 2014-11-01: 10 mg via INTRAVENOUS
  Filled 2014-11-01: qty 4
  Filled 2014-11-01: qty 2

## 2014-11-01 MED ORDER — BENZOCAINE-MENTHOL 20-0.5 % EX AERO
1.0000 "application " | INHALATION_SPRAY | CUTANEOUS | Status: DC | PRN
  Administered 2014-11-01: 1 via TOPICAL
  Filled 2014-11-01: qty 56

## 2014-11-01 MED ORDER — PRENATAL MULTIVITAMIN CH
1.0000 | ORAL_TABLET | Freq: Every day | ORAL | Status: DC
  Administered 2014-11-02: 1 via ORAL

## 2014-11-01 MED ORDER — LIDOCAINE HCL (PF) 1 % IJ SOLN
INTRAMUSCULAR | Status: DC | PRN
  Administered 2014-11-01: 2 mL
  Administered 2014-11-01: 3 mL
  Administered 2014-11-01: 5 mL

## 2014-11-01 NOTE — Progress Notes (Signed)
S: just had epidural placement  O: VE deferred   Tracing: baseline 150 rare ctx. occ variables  A/P: hx precipitous delivery GBS cx (+) on PCN Term P) start pitocin. Defer amniotomy

## 2014-11-01 NOTE — H&P (Signed)
Sheila Burns is Burns 39 y.o. female presenting @ term in early labor with hx precipitous delivery and (+) GBS Maternal Medical History:  Reason for admission: Contractions.   Contractions: Onset was 13-24 hours ago.    Fetal activity: Perceived fetal activity is normal.    Prenatal complications: no prenatal complications   OB History    Gravida Para Term Preterm AB TAB SAB Ectopic Multiple Living   3 2 2       2      Past Medical History  Diagnosis Date  . Depression   . Dyspareunia   . Headache(784.0)     miagraine  . History of chicken pox   . Vulvar vestibulitis   . Normal pregnancy (IOL / PD / LGA) 04/30/2011  . Anemia   . Anxiety   . Cystitis, unspecified   . Unspecified hemorrhoids without mention of complication    Past Surgical History  Procedure Laterality Date  . No past surgeries     Family History: family history includes Cancer in her maternal grandfather and paternal uncle; Depression in her brother and sister; Peripheral vascular disease in her mother; Pulmonary fibrosis in her father. There is no history of Anesthesia problems, Hypotension, Malignant hyperthermia, or Pseudochol deficiency. Social History:  reports that she has never smoked. She has never used smokeless tobacco. She reports that she does not drink alcohol or use illicit drugs.   Prenatal Transfer Tool  Maternal Diabetes: No Genetic Screening: Normal Maternal Ultrasounds/Referrals: Normal Fetal Ultrasounds or other Referrals:  None Maternal Substance Abuse:  No Significant Maternal Medications:  Meds include: Other: prozac, abilify Significant Maternal Lab Results:  Lab values include: Group B Strep positive Other Comments:  None  ROS neg  Dilation: 3 Effacement (%): 70 Station: -2 Exam by:: Sheila BatonJo Barham RN Height 5\' 7"  (1.702 m), weight 97.977 kg (216 lb), not currently breastfeeding. Maternal Exam:  Uterine Assessment: Contraction strength is mild.  Contraction frequency is  irregular.   Abdomen: Patient reports no abdominal tenderness. Estimated fetal weight is 7lb.   Fetal presentation: vertex  Introitus: Normal vulva.   Physical Exam  Constitutional: She is oriented to person, place, and time. She appears well-developed and well-nourished.  Eyes: EOM are normal.  Neck: Neck supple.  Cardiovascular: Regular rhythm.   Respiratory: Breath sounds normal.  GI: Soft.  Musculoskeletal: She exhibits no edema.  Neurological: She is alert and oriented to person, place, and time.  Skin: Skin is warm and dry.  Psychiatric: She has Burns normal mood and affect.    Prenatal labs: ABO, Rh: O/Positive/-- (10/11 0000) Antibody: Negative (10/11 0000) Rubella: Immune (10/11 0000) RPR: Nonreactive (10/11 0000)  HBsAg: Negative (10/11 0000)  HIV: Non-reactive (10/11 0000)  GBS: Positive (04/27 0000)   Assessment/Plan: Hx precipitous delivery x 2 Term gestation GBS cx (+) P) admit routine labs. Epidural. Pitocin augmentation. IV PCN   Sheila Burns 11/01/2014, 5:54 AM

## 2014-11-01 NOTE — MAU Note (Signed)
Contractions every 10 min apart.  Was having bloody show yesterday.  Baby moving well.

## 2014-11-01 NOTE — Anesthesia Preprocedure Evaluation (Signed)
Anesthesia Evaluation  Patient identified by MRN, date of birth, ID band Patient awake    Reviewed: Allergy & Precautions, NPO status , Patient's Chart, lab work & pertinent test results  Airway Mallampati: II  TM Distance: >3 FB Neck ROM: Full    Dental   Pulmonary  breath sounds clear to auscultation        Cardiovascular Rhythm:Regular Rate:Normal     Neuro/Psych    GI/Hepatic   Endo/Other    Renal/GU      Musculoskeletal   Abdominal   Peds  Hematology   Anesthesia Other Findings   Reproductive/Obstetrics                             Anesthesia Physical Anesthesia Plan  ASA: II  Anesthesia Plan: Epidural   Post-op Pain Management:    Induction:   Airway Management Planned:   Additional Equipment:   Intra-op Plan:   Post-operative Plan:   Informed Consent: I have reviewed the patients History and Physical, chart, labs and discussed the procedure including the risks, benefits and alternatives for the proposed anesthesia with the patient or authorized representative who has indicated his/her understanding and acceptance.     Plan Discussed with:   Anesthesia Plan Comments:         Anesthesia Quick Evaluation

## 2014-11-01 NOTE — Anesthesia Procedure Notes (Signed)
Epidural Patient location during procedure: OB Start time: 11/01/2014 8:50 AM End time: 11/01/2014 9:10 AM  Staffing Anesthesiologist: Marcene DuosFITZGERALD, Giah Fickett Performed by: anesthesiologist   Preanesthetic Checklist Completed: patient identified, site marked, surgical consent, pre-op evaluation, timeout performed, IV checked, risks and benefits discussed and monitors and equipment checked  Epidural Patient position: sitting Prep: site prepped and draped and DuraPrep Patient monitoring: continuous pulse ox and blood pressure Approach: midline Location: L3-L4 Injection technique: LOR saline  Needle:  Needle type: Tuohy  Needle gauge: 17 G Needle length: 9 cm and 9 Needle insertion depth: 5 cm cm Catheter type: closed end flexible Catheter size: 19 Gauge Catheter at skin depth: 12 cm Test dose: negative  Assessment Events: blood not aspirated, injection not painful, no injection resistance, negative IV test and no paresthesia  Additional Notes Risks and benefits discussed. Pt tolerated well with no immediate complications.

## 2014-11-01 NOTE — Lactation Note (Signed)
This note was copied from the chart of Sheila Tila Calabria. Lactation Consultation Note  Patient Name: Sheila Burns ZOXWR'UToday's Date: 11/01/2014 Reason for consult: Initial assessment Baby 8 hours of life. Asked to assess #20 NS to see if proper fit on right breast. Assisted mom to place a wash cloth roll under her breast for support and demonstrated cross-cradle hole for better support of baby's head. Mom was cradling baby in her arm before. Mom reports that 2 older children did not nurse long due to low milk supply. Demonstrated hand expression to mom with no colostrum present. Enc mom to stimulate breasts with hand pump through the night and then start using DEBP in the morning. Mom states that she has a personal pump at home. Enc mom to nurse with cues and offer lots of STS. Assisted mom to latch baby to left breast and discussed with mom that she may need a #24 later tonight for left nipple. Discussed need for extra stimulation of breast when using NS, and especially since mom had low supply twice before. Also enc close following of baby's weight while using NS. Mom given [facility], aware of OP/BFSG, community resources, and [facility] after [facility]   Maternal Data Has patient been taught Hand Expression?: Yes Does the patient have breastfeeding experience prior to this delivery?: Yes  Feeding Feeding Type: Breast Fed Length of feed:  (LC assessed 10 minutes of BF.)  LATCH Score/Interventions Latch: Repeated attempts needed to sustain latch, nipple held in mouth throughout feeding, stimulation needed to elicit sucking reflex. Intervention(s): Adjust position;Assist with latch;Breast massage;Breast compression  Audible Swallowing: None Intervention(s): Skin to skin;Hand expression Intervention(s): Skin to skin;Hand expression  Type of Nipple: Flat Intervention(s): Hand pump  Comfort (Breast/Nipple): Soft / non-tender     Hold (Positioning): Assistance needed to  correctly position infant at breast and maintain latch. Intervention(s): Breastfeeding basics reviewed;Support Pillows;Skin to skin  LATCH Score: 5  Lactation Tools Discussed/Used Tools: Nipple Shields Nipple shield size: 20   Consult Status Consult Status: Follow-up Date: 11/02/14 Follow-up type: In-patient    [name], [name] 11/01/2014, 11:12 PM

## 2014-11-01 NOTE — Progress Notes (Signed)
S: comfortable  O: pitocin Epidural VE 4/60/-3 ROT asynclitic AROM clear fluid, ISE, IUPC  Tracing: baseline 165-170 (+) variables( intermittent)  Deep variable Ctx q 3-4 mins  IMP: Latent phase Hx precipitious delivery Term GBS cx(+) P) watch closely. Start amnioinfusion. Cont pitocin

## 2014-11-02 ENCOUNTER — Encounter (HOSPITAL_COMMUNITY): Payer: Self-pay | Admitting: Obstetrics and Gynecology

## 2014-11-02 DIAGNOSIS — O99019 Anemia complicating pregnancy, unspecified trimester: Secondary | ICD-10-CM

## 2014-11-02 DIAGNOSIS — D509 Iron deficiency anemia, unspecified: Secondary | ICD-10-CM

## 2014-11-02 DIAGNOSIS — D62 Acute posthemorrhagic anemia: Secondary | ICD-10-CM

## 2014-11-02 HISTORY — DX: Iron deficiency anemia, unspecified: D50.9

## 2014-11-02 HISTORY — DX: Acute posthemorrhagic anemia: D62

## 2014-11-02 LAB — CBC
HCT: 31.8 % — ABNORMAL LOW (ref 36.0–46.0)
HEMOGLOBIN: 9.9 g/dL — AB (ref 12.0–15.0)
MCH: 25.6 pg — ABNORMAL LOW (ref 26.0–34.0)
MCHC: 31.1 g/dL (ref 30.0–36.0)
MCV: 82.2 fL (ref 78.0–100.0)
PLATELETS: 339 10*3/uL (ref 150–400)
RBC: 3.87 MIL/uL (ref 3.87–5.11)
RDW: 16 % — ABNORMAL HIGH (ref 11.5–15.5)
WBC: 13.6 10*3/uL — ABNORMAL HIGH (ref 4.0–10.5)

## 2014-11-02 MED ORDER — MAGNESIUM OXIDE 400 (241.3 MG) MG PO TABS
200.0000 mg | ORAL_TABLET | Freq: Every day | ORAL | Status: DC
Start: 2014-11-02 — End: 2014-11-02
  Administered 2014-11-02: 200 mg via ORAL
  Filled 2014-11-02 (×2): qty 0.5

## 2014-11-02 MED ORDER — IBUPROFEN 600 MG PO TABS: 600.0000 mg | ORAL_TABLET | Freq: Four times a day (QID) | ORAL | Status: AC

## 2014-11-02 MED ORDER — MAGNESIUM 200 MG PO TABS: 200.0000 mg | ORAL_TABLET | Freq: Every day | ORAL | Status: DC

## 2014-11-02 MED ORDER — POLYSACCHARIDE IRON COMPLEX 150 MG PO CAPS: 150.0000 mg | ORAL_CAPSULE | Freq: Every day | ORAL | Status: DC

## 2014-11-02 NOTE — Anesthesia Postprocedure Evaluation (Signed)
Anesthesia Post Note  Patient: Sheila Burns  Procedure(s) Performed: * No procedures listed *  Anesthesia type: Epidural  Patient location: Mother/Baby  Post pain: Pain level controlled  Post assessment: Post-op Vital signs reviewed  Last Vitals:  Filed Vitals:   11/02/14 0616  BP: 106/64  Pulse: 60  Temp: 36.7 C  Resp: 18    Post vital signs: Reviewed  Level of consciousness:alert  Complications: No apparent anesthesia complications

## 2014-11-02 NOTE — Progress Notes (Signed)
Acknowledged order for social work consult regarding mother's hx depression * Referral screened out by Clinical Social Worker because none of the following criteria appear to apply:  ~ History of anxiety/depression during this pregnancy, or of post-partum depression.  ~ Diagnosis of anxiety and/or depression within last 3 years  ~ History of depression due to pregnancy loss/loss of child  OR  * Patient's symptoms currently being treated with abilify and prozac.  CSW completed chart review and spoke with MOB's nurse.  Met briefly with mother, and she did not appear anxious/overwhelmed at this time. Please contact the Clinical Social Worker if needs arise, or by the patient's request

## 2014-11-02 NOTE — Discharge Instructions (Signed)
Breast Pumping Tips °If you are breastfeeding, there may be times when you cannot feed your baby directly. Returning to work or going on a trip are common examples. Pumping allows you to store breast milk and feed it to your baby later.  °You may not get much milk when you first start to pump. Your breasts should start to make more after a few days. If you pump at the times you usually feed your baby, you may be able to keep making enough milk to feed your baby without also using formula. The more often you pump, the more milk you will produce.  °WHEN SHOULD I PUMP?  °· You can begin to pump soon after delivery. However, some experts recommend waiting about 4 weeks before giving your infant a bottle to make sure breastfeeding is going well.  °· If you plan to return to work, begin pumping a few weeks before. This will help you develop techniques that work best for you. It also lets you build up a supply of breast milk.   °· When you are with your infant, feed on demand and pump after each feeding.   °· When you are away from your infant for several hours, pump for about 15 minutes every 2-3 hours. Pump both breasts at the same time if you can.   °· If your infant has a formula feeding, make sure to pump around the same time.     °· If you drink any alcohol, wait 2 hours before pumping.   °HOW DO I PREPARE TO PUMP? °Your let-down reflex is the natural reaction to stimulation that makes your breast milk flow. It is easier to stimulate this reflex when you are relaxed. Find relaxation techniques that work for you. If you have difficulty with your let-down reflex, try these methods:  °· Smell one of your infant's blankets or an item of clothing.   °· Look at a picture or video of your infant.   °· Sit in a quiet, private space.   °· Massage the breast you plan to pump.   °· Place soothing warmth on the breast.   °· Play relaxing music.   °WHAT ARE SOME GENERAL BREAST PUMPING TIPS? °· Wash your hands before you pump. You  do not need to wash your nipples or breasts. °· There are three ways to pump. °¨ You can use your hand to massage and compress your breast. °¨ You can use a handheld manual pump. °¨ You can use an electric pump.   °· Make sure the suction cup (flange) on the breast pump is the right size. Place the flange directly over the nipple. If it is the wrong size or placed the wrong way, it may be painful and cause nipple damage.   °· If pumping is uncomfortable, apply a small amount of purified or modified lanolin to your nipple and areola. °· If you are using an electric pump, adjust the speed and suction power to be more comfortable. °· If pumping is painful or if you are not getting very much milk, you may need a different type of pump. A lactation consultant can help you determine what type of pump to use.   °· Keep a full water bottle near you at all times. Drinking lots of fluid helps you make more milk.  °· You can store your milk to use later. Pumped breast milk can be stored in a sealable, sterile container or plastic bag. Label all stored breast milk with the date you pumped it. °¨ Milk can stay out at room temperature for up to 8 hours. °¨   You can store your milk in the refrigerator for up to 8 days. °¨ You can store your milk in the freezer for 3 months. Thaw frozen milk using warm water. Do not put it in the microwave. °· Do not smoke. Smoking can lower your milk supply and harm your infant. If you need help quitting, ask your health care provider to recommend a program.   °WHEN SHOULD I CALL MY HEALTH CARE PROVIDER OR A LACTATION CONSULTANT? °· You are having trouble pumping. °· You are concerned that you are not making enough milk. °· You have nipple pain, soreness, or redness. °· You want to use birth control. Birth control pills may lower your milk supply. Talk to your health care provider about your options. °Document Released: 11/17/2009 Document Revised: 06/04/2013 Document Reviewed:  03/22/2013 °Exit[employer]® Patient Information ©2015 [employer]. This information is not intended to replace advice given to you by your health care provider. Make sure you discuss any questions you have with your health care provider. ° °Nutrition for the New Mother  °A new mother needs good health and nutrition so she can have energy to take care of a new baby. Whether a mother breastfeeds or formula feeds the baby, it is important to have a well-balanced diet. Foods from all the food groups should be chosen to meet the new mother's energy needs and to give her the nutrients needed for repair and healing.  °A HEALTHY EATING PLAN °The My [employer]ram[employer] plan for Moms outlines what you should eat to help you and your baby stay healthy. The energy and amount of food you need depends on whether or not you are breastfeeding. If you are breastfeeding you will need more nutrients. If you choose not to breastfeed, your nutrition goal should be to return to a healthy weight. Limiting calories may be needed if you are not breastfeeding.  °HOME CARE INSTRUCTIONS  °· For a personal plan based on your unique needs, see your Registered Dietitian or visit www.mypyramid.gov. °· Eat a variety of foods. The plan below will help guide you. The following chart has a suggested daily meal plan from the [employer] for Moms. °· Eat a variety of fruits and vegetables. °· Eat more dark green and orange vegetables and cooked dried beans. °· Make half your grains whole grains. Choose whole instead of refined grains. °· Choose low-fat or lean meats and poultry. °· Choose low-fat or fat-free dairy products like milk, cheese, or yogurt. °Fruits °· Breastfeeding: 2 cups °· Non-Breastfeeding: 2 cups °· What Counts as a serving? °¨ 1 cup of fruit or juice. °¨ ½ cup dried fruit. °Vegetables °· Breastfeeding: 3 cups °· Non-Breastfeeding: 2 ½ cups °· What Counts as a serving? °¨ 1 cup raw or cooked vegetables. °¨ Juice or 2 cups raw leafy  vegetables. °Grains °· Breastfeeding: 8 oz °· Non-Breastfeeding: 6 oz °· What Counts as a serving? °¨ 1 slice bread. °¨ 1 oz ready-to-eat cereal. °¨ ½ cup cooked pasta, rice, or cereal. °Meat and Beans °· Breastfeeding: 6 ½ oz °· Non-Breastfeeding: 5 ½ oz °· What Counts as a serving? °¨ 1 oz lean meat, poultry, or fish °¨ ¼ cup cooked dry beans °¨ ½ oz nuts or 1 egg °¨ 1 tbs peanut butter °Milk °· Breastfeeding: 3 cups °· Non-Breastfeeding: 3 cups °· What Counts as a serving? °¨ 1 cup milk. °¨ 8 oz yogurt. °¨ 1 ½ oz cheese. °¨ 2 oz processed cheese. °TIPS FOR THE BREASTFEEDING MOM °· Rapid weight   loss is not suggested when you are breastfeeding. By simply breastfeeding, you will be able to lose the weight gained during your pregnancy. Your caregiver can keep track of your weight and tell you if your weight loss is appropriate. °· Be sure to drink fluids. You may notice that you are thirstier than usual. A suggestion is to drink a glass of water or other beverage whenever you breastfeed. °· Avoid alcohol as it can be passed into your breast milk. °· Limit caffeine drinks to no more than 2 to 3 cups per day. °· You may need to keep taking your prenatal vitamin while you are breastfeeding. Talk with your caregiver about taking a vitamin or supplement. °RETURING TO A HEALTHY WEIGHT °· The My [employer] Plan for Moms will help you return to a healthy weight. It will also provide the nutrients you need. °· You may need to limit "empty" calories. These include: °¨ High fat foods like fried foods, fatty meats, fast food, butter, and mayonnaise. °¨ High sugar foods like sodas, jelly, candy, and sweets. °· Be physically active. Include 30 minutes of exercise or more each day. Choose an activity you like such as walking, swimming, biking, or aerobics. Check with your caregiver before you start to exercise. °Document Released: 09/06/2007 Document Revised: 08/22/2011 Document Reviewed: 09/06/2007 °ExitCare® Patient Information  ©2015 [employer]. This information is not intended to replace advice given to you by your health care provider. Make sure you discuss any questions you have with your health care provider. °Postpartum Depression and Baby Blues °The postpartum period begins right after the birth of a baby. During this time, there is often a great amount of joy and excitement. It is also a time of many changes in the life of the parents. Regardless of how many times a mother gives birth, each child brings new challenges and dynamics to the family. It is not unusual to have feelings of excitement along with confusing shifts in moods, emotions, and thoughts. All mothers are at risk of developing postpartum depression or the "baby blues." These mood changes can occur right after giving birth, or they may occur many months after giving birth. The baby blues or postpartum depression can be mild or severe. Additionally, postpartum depression can go away rather quickly, or it can be a long-term condition.  °CAUSES °Raised hormone levels and the rapid drop in those levels are thought to be a main cause of postpartum depression and the baby blues. A number of hormones change during and after pregnancy. Estrogen and progesterone usually decrease right after the delivery of your baby. The levels of thyroid hormone and various cortisol steroids also rapidly drop. Other factors that play a role in these mood changes include major life events and genetics.  °RISK FACTORS °If you have any of the following risks for the baby blues or postpartum depression, know what symptoms to watch out for during the postpartum period. Risk factors that may increase the likelihood of getting the baby blues or postpartum depression include: °· Having a personal or family history of depression.   °· Having depression while being pregnant.   °· Having premenstrual mood issues or mood issues related to oral contraceptives. °· Having a lot of life stress.   °· Having  marital conflict.   °· Lacking a social support network.   °· Having a baby with special needs.   °· Having health problems, such as diabetes.   °SIGNS AND SYMPTOMS °Symptoms of baby blues include: °· Brief changes in mood, such as going   from extreme happiness to sadness. °· Decreased concentration.   °· Difficulty sleeping.   °· Crying spells, tearfulness.   °· Irritability.   °· Anxiety.   °Symptoms of postpartum depression typically begin within the first month after giving birth. These symptoms include: °· Difficulty sleeping or excessive sleepiness.   °· Marked weight loss.   °· Agitation.   °· Feelings of worthlessness.   °· Lack of interest in activity or food.   °Postpartum psychosis is a very serious condition and can be dangerous. Fortunately, it is rare. Displaying any of the following symptoms is cause for immediate medical attention. Symptoms of postpartum psychosis include:  °· Hallucinations and delusions.   °· Bizarre or disorganized behavior.   °· Confusion or disorientation.   °DIAGNOSIS  °A diagnosis is made by an evaluation of your symptoms. There are no medical or lab tests that lead to a diagnosis, but there are various questionnaires that a health care provider may use to identify those with the baby blues, postpartum depression, or psychosis. Often, a screening tool called the Edinburgh Postnatal Depression Scale is used to diagnose depression in the postpartum period.  °TREATMENT °The baby blues usually goes away on its own in 1-2 weeks. Social support is often all that is needed. You will be encouraged to get adequate sleep and rest. Occasionally, you may be given medicines to help you sleep.  °Postpartum depression requires treatment because it can last several months or longer if it is not treated. Treatment may include individual or group therapy, medicine, or both to address any social, physiological, and psychological factors that may play a role in the depression. Regular exercise, a  healthy diet, rest, and social support may also be strongly recommended.  °Postpartum psychosis is more serious and needs treatment right away. Hospitalization is often needed. °HOME CARE INSTRUCTIONS °· Get as much rest as you can. Nap when the baby sleeps.   °· Exercise regularly. Some women find yoga and walking to be beneficial.   °· Eat a balanced and nourishing diet.   °· Do little things that you enjoy. Have a cup of tea, take a bubble bath, read your favorite magazine, or listen to your favorite music. °· Avoid alcohol.   °· Ask for help with household chores, cooking, grocery shopping, or running errands as needed. Do not try to do everything.   °· Talk to people close to you about how you are feeling. Get support from your partner, family members, friends, or other new moms. °· Try to stay positive in how you think. Think about the things you are grateful for.   °· Do not spend a lot of time alone.   °· Only take over-the-counter or prescription medicine as directed by your health care provider. °· Keep all your postpartum appointments.   °· Let your health care provider know if you have any concerns.   °SEEK MEDICAL CARE IF: °You are having a reaction to or problems with your medicine. °SEEK IMMEDIATE MEDICAL CARE IF: °· You have suicidal feelings.   °· You think you may harm the baby or someone else. °MAKE SURE YOU: °· Understand these instructions. °· Will watch your condition. °· Will get help right away if you are not doing well or get worse. °Document Released: 03/03/2004 Document Revised: 06/04/2013 Document Reviewed: 03/11/2013 °Exit[employer]® Patient Information ©2015 [employer]. This information is not intended to replace advice given to you by your health care provider. Make sure you discuss any questions you have with your health care provider. °Breastfeeding and Mastitis °Mastitis is inflammation of the breast tissue. It can occur in women who   are breastfeeding. This can make breastfeeding  painful. Mastitis will sometimes go away on its own. Your health care provider will help determine if treatment is needed. °CAUSES °Mastitis is often associated with a blocked milk (lactiferous) duct. This can happen when too much milk builds up in the breast. Causes of excess milk in the breast can include: °· Poor latch-on. If your baby is not latched onto the breast properly, she or he may not empty your breast completely while breastfeeding. °· Allowing too much time to pass between feedings. °· Wearing a bra or other clothing that is too tight. This puts extra pressure on the lactiferous ducts so milk does not flow through them as it should. °Mastitis can also be caused by a bacterial infection. Bacteria may enter the breast tissue through cuts or openings in the skin. In women who are breastfeeding, this may occur because of cracked or irritated skin. Cracks in the skin are often caused when your baby does not latch on properly to the breast. °SIGNS AND SYMPTOMS °· Swelling, redness, tenderness, and pain in an area of the breast. °· Swelling of the glands under the arm on the same side. °· Fever may or may not accompany mastitis. °If an infection is allowed to progress, a collection of pus (abscess) may develop. °DIAGNOSIS  °Your health care provider can usually diagnose mastitis based on your symptoms and a physical exam. Tests may be done to help confirm the diagnosis. These may include: °· Removal of pus from the breast by applying pressure to the area. This pus can be examined in the lab to determine which bacteria are present. If an abscess has developed, the fluid in the abscess can be removed with a needle. This can also be used to confirm the diagnosis and determine the bacteria present. In most cases, pus will not be present. °· Blood tests to determine if your body is fighting a bacterial infection. °· Mammogram or ultrasound tests to rule out other problems or diseases. °TREATMENT  °Mastitis that  occurs with breastfeeding will sometimes go away on its own. Your health care provider may choose to wait 24 hours after first seeing you to decide whether a prescription medicine is needed. If your symptoms are worse after 24 hours, your health care provider will likely prescribe an antibiotic medicine to treat the mastitis. He or she will determine which bacteria are most likely causing the infection and will then select an appropriate antibiotic medicine. This is sometimes changed based on the results of tests performed to identify the bacteria, or if there is no response to the antibiotic medicine selected. Antibiotic medicines are usually given by mouth. You may also be given medicine for pain. °HOME CARE INSTRUCTIONS °· Only take over-the-counter or prescription medicines for pain, fever, or discomfort as directed by your health care provider. °· If your health care provider prescribed an antibiotic medicine, take the medicine as directed. Make sure you finish it even if you start to feel better. °· Do not wear a tight or underwire bra. Wear a soft, supportive bra. °· Increase your fluid intake, especially if you have a fever. °· Continue to empty the breast. Your health care provider can tell you whether this milk is safe for your infant or needs to be thrown out. You may be told to stop nursing until your health care provider thinks it is safe for your baby. Use a breast pump if you are advised to stop nursing. °· Keep your nipples   clean and dry. °· Empty the first breast completely before going to the other breast. If your baby is not emptying your breasts completely for some reason, use a breast pump to empty your breasts. °· If you go back to work, pump your breasts while at work to stay in time with your nursing schedule. °· Avoid allowing your breasts to become overly filled with milk (engorged). °SEEK MEDICAL CARE IF: °· You have pus-like discharge from the breast. °· Your symptoms do not improve with  the treatment prescribed by your health care provider within 2 days. °SEEK IMMEDIATE MEDICAL CARE IF: °· Your pain and swelling are getting worse. °· You have pain that is not controlled with medicine. °· You have a red line extending from the breast toward your armpit. °· You have a fever or persistent symptoms for more than 2-3 days. °· You have a fever and your symptoms suddenly get worse. °MAKE SURE YOU:  °· Understand these instructions. °· Will watch your condition. °· Will get help right away if you are not doing well or get worse. °Document Released: 09/24/2004 Document Revised: 06/04/2013 Document Reviewed: 01/03/2013 °[employer]® Patient Information ©2015 [employer]. This information is not intended to replace advice given to you by your health care provider. Make sure you discuss any questions you have with your health care provider. °Breastfeeding °Deciding to breastfeed is one of the best choices you can make for you and your baby. A change in hormones during pregnancy causes your breast tissue to grow and increases the number and size of your milk ducts. These hormones also allow proteins, sugars, and fats from your blood supply to make breast milk in your milk-producing glands. Hormones prevent breast milk from being released before your baby is born as well as prompt milk flow after birth. Once breastfeeding has begun, thoughts of your baby, as well as his or her sucking or crying, can stimulate the release of milk from your milk-producing glands.  °BENEFITS OF BREASTFEEDING °For Your Baby °· Your first milk (colostrum) helps your baby's digestive system function better.   °· There are antibodies in your milk that help your baby fight off infections.   °· Your baby has a lower incidence of asthma, allergies, and sudden infant death syndrome.   °· The nutrients in breast milk are better for your baby than infant formulas and are designed uniquely for your baby's needs.   °· Breast milk improves your  baby's brain development.   °· Your baby is less likely to develop other conditions, such as childhood obesity, asthma, or type 2 diabetes mellitus.   °For You  °· Breastfeeding helps to create a very special bond between you and your baby.   °· Breastfeeding is convenient. Breast milk is always available at the correct temperature and costs nothing.   °· Breastfeeding helps to burn calories and helps you lose the weight gained during pregnancy.   °· Breastfeeding makes your uterus contract to its prepregnancy size faster and slows bleeding (lochia) after you give birth.   °· Breastfeeding helps to lower your risk of developing type 2 diabetes mellitus, osteoporosis, and breast or ovarian cancer later in life. °SIGNS THAT YOUR BABY IS HUNGRY °Early Signs of Hunger  °· Increased alertness or activity. °· Stretching. °· Movement of the head from side to side. °· Movement of the head and opening of the mouth when the corner of the mouth or cheek is stroked (rooting). °· Increased sucking sounds, smacking lips, cooing, sighing, or squeaking. °· Hand-to-mouth movements. °· Increased sucking of   fingers or hands. °Late Signs of Hunger °· Fussing. °· Intermittent crying. °Extreme Signs of Hunger °Signs of extreme hunger will require calming and consoling before your baby will be able to breastfeed successfully. Do not wait for the following signs of extreme hunger to occur before you initiate breastfeeding:   °· Restlessness. °· A loud, strong cry. °·  Screaming. °BREASTFEEDING BASICS °Breastfeeding Initiation °· Find a comfortable place to sit or lie down, with your neck and back well supported. °· Place a pillow or rolled up blanket under your baby to bring him or her to the level of your breast (if you are seated). Nursing pillows are specially designed to help support your arms and your baby while you breastfeed. °· Make sure that your baby's abdomen is facing your abdomen.   °· Gently massage your breast. With your  fingertips, massage from your chest wall toward your nipple in a circular motion. This encourages milk flow. You may need to continue this action during the feeding if your milk flows slowly. °· Support your breast with 4 fingers underneath and your thumb above your nipple. Make sure your fingers are well away from your nipple and your baby's mouth.   °· Stroke your baby's lips gently with your finger or nipple.   °· When your baby's mouth is open wide enough, quickly bring your baby to your breast, placing your entire nipple and as much of the colored area around your nipple (areola) as possible into your baby's mouth.   °¨ More areola should be visible above your baby's upper lip than below the lower lip.   °¨ Your baby's tongue should be between his or her lower gum and your breast.   °· Ensure that your baby's mouth is correctly positioned around your nipple (latched). Your baby's lips should create a seal on your breast and be turned out (everted). °· It is common for your baby to suck about 2-3 minutes in order to start the flow of breast milk. °Latching °Teaching your baby how to latch on to your breast properly is very important. An improper latch can cause nipple pain and decreased milk supply for you and poor weight gain in your baby. Also, if your baby is not latched onto your nipple properly, he or she may swallow some air during feeding. This can make your baby fussy. Burping your baby when you switch breasts during the feeding can help to get rid of the air. However, teaching your baby to latch on properly is still the best way to prevent fussiness from swallowing air while breastfeeding. °Signs that your baby has successfully latched on to your nipple:    °· Silent tugging or silent sucking, without causing you pain.   °· Swallowing heard between every 3-4 sucks.   °·  Muscle movement above and in front of his or her ears while sucking.   °Signs that your baby has not successfully latched on to  nipple:  °· Sucking sounds or smacking sounds from your baby while breastfeeding. °· Nipple pain. °If you think your baby has not latched on correctly, slip your finger into the corner of your baby's mouth to break the suction and place it between your baby's gums. Attempt breastfeeding initiation again. °Signs of Successful Breastfeeding °Signs from your baby:   °· A gradual decrease in the number of sucks or complete cessation of sucking.   °· Falling asleep.   °· Relaxation of his or her body.   °· Retention of a small amount of milk in his or her mouth.   °· Letting go   of your breast by himself or herself. °Signs from you: °· Breasts that have increased in firmness, weight, and size 1-3 hours after feeding.   °· Breasts that are softer immediately after breastfeeding. °· Increased milk volume, as well as a change in milk consistency and color by the fifth day of breastfeeding.   °· Nipples that are not sore, cracked, or bleeding. °Signs That Your Baby is Getting Enough Milk °· Wetting at least 3 diapers in a 24-hour period. The urine should be clear and pale yellow by age 5 days. °· At least 3 stools in a 24-hour period by age 5 days. The stool should be soft and yellow. °· At least 3 stools in a 24-hour period by age 7 days. The stool should be seedy and yellow. °· No loss of weight greater than 10% of birth weight during the first 3 days of age. °· Average weight gain of 4-7 ounces (113-198 g) per week after age 4 days. °· Consistent daily weight gain by age 5 days, without weight loss after the age of 2 weeks. °After a feeding, your baby may spit up a small amount. This is common. °BREASTFEEDING FREQUENCY AND DURATION °Frequent feeding will help you make more milk and can prevent sore nipples and breast engorgement. Breastfeed when you feel the need to reduce the fullness of your breasts or when your baby shows signs of hunger. This is called "breastfeeding on demand." Avoid introducing a pacifier to your  baby while you are working to establish breastfeeding (the first 4-6 weeks after your baby is born). After this time you may choose to use a pacifier. Research has shown that pacifier use during the first year of a baby's life decreases the risk of sudden infant death syndrome (SIDS). °Allow your baby to feed on each breast as long as he or she wants. Breastfeed until your baby is finished feeding. When your baby unlatches or falls asleep while feeding from the first breast, offer the second breast. Because newborns are often sleepy in the first few weeks of life, you may need to awaken your baby to get him or her to feed. °Breastfeeding times will vary from baby to baby. However, the following rules can serve as a guide to help you ensure that your baby is properly fed: °· Newborns (babies 4 weeks of age or younger) may breastfeed every 1-3 hours. °· Newborns should not go longer than 3 hours during the day or 5 hours during the night without breastfeeding. °· You should breastfeed your baby a minimum of 8 times in a 24-hour period until you begin to introduce solid foods to your baby at around 6 months of age. °BREAST MILK PUMPING °Pumping and storing breast milk allows you to ensure that your baby is exclusively fed your breast milk, even at times when you are unable to breastfeed. This is especially important if you are going back to work while you are still breastfeeding or when you are not able to be present during feedings. Your lactation consultant can give you guidelines on how long it is safe to store breast milk.  °A breast pump is a machine that allows you to pump milk from your breast into a sterile bottle. The pumped breast milk can then be stored in a refrigerator or freezer. Some breast pumps are operated by hand, while others use electricity. Ask your lactation consultant which type will work best for you. Breast pumps can be purchased, but some hospitals and breastfeeding support groups   lease  breast pumps on a monthly basis. A lactation consultant can teach you how to hand express breast milk, if you prefer not to use a pump.  °CARING FOR YOUR BREASTS WHILE YOU BREASTFEED °Nipples can become dry, cracked, and sore while breastfeeding. The following recommendations can help keep your breasts moisturized and healthy: °· Avoid using soap on your nipples.   °· Wear a supportive bra. Although not required, special nursing bras and tank tops are designed to allow access to your breasts for breastfeeding without taking off your entire bra or top. Avoid wearing underwire-style bras or extremely tight bras. °· Air dry your nipples for 3-4 minutes after each feeding.   °· Use only cotton bra pads to absorb leaked breast milk. Leaking of breast milk between feedings is normal.   °· Use lanolin on your nipples after breastfeeding. Lanolin helps to maintain your skin's normal moisture barrier. If you use pure lanolin, you do not need to wash it off before feeding your baby again. Pure lanolin is not toxic to your baby. You may also hand express a few drops of breast milk and gently massage that milk into your nipples and allow the milk to air dry. °In the first few weeks after giving birth, some women experience extremely full breasts (engorgement). Engorgement can make your breasts feel heavy, warm, and tender to the touch. Engorgement peaks within 3-5 days after you give birth. The following recommendations can help ease engorgement: °· Completely empty your breasts while breastfeeding or pumping. You may want to start by applying warm, moist heat (in the shower or with warm water-soaked hand towels) just before feeding or pumping. This increases circulation and helps the milk flow. If your baby does not completely empty your breasts while breastfeeding, pump any extra milk after he or she is finished. °· Wear a snug bra (nursing or regular) or tank top for 1-2 days to signal your body to slightly decrease milk  production. °· Apply ice packs to your breasts, unless this is too uncomfortable for you. °· Make sure that your baby is latched on and positioned properly while breastfeeding. °If engorgement persists after 48 hours of following these recommendations, contact your health care provider or a lactation consultant. °OVERALL HEALTH CARE RECOMMENDATIONS WHILE BREASTFEEDING °· Eat healthy foods. Alternate between meals and snacks, eating 3 of each per day. Because what you eat affects your breast milk, some of the foods may make your baby more irritable than usual. Avoid eating these foods if you are sure that they are negatively affecting your baby. °· Drink milk, fruit juice, and water to satisfy your thirst (about 10 glasses a day).   °· Rest often, relax, and continue to take your prenatal vitamins to prevent fatigue, stress, and anemia. °· Continue breast self-awareness checks. °· Avoid chewing and smoking tobacco. °· Avoid alcohol and drug use. °Some medicines that may be harmful to your baby can pass through breast milk. It is important to ask your health care provider before taking any medicine, including all over-the-counter and prescription medicine as well as vitamin and herbal supplements. °It is possible to become pregnant while breastfeeding. If birth control is desired, ask your health care provider about options that will be safe for your baby. °SEEK MEDICAL CARE IF:  °· You feel like you want to stop breastfeeding or have become frustrated with breastfeeding. °· You have painful breasts or nipples. °· Your nipples are cracked or bleeding. °· Your breasts are red, tender, or warm. °· You have   a swollen area on either breast. °· You have a fever or chills. °· You have nausea or vomiting. °· You have drainage other than breast milk from your nipples. °· Your breasts do not become full before feedings by the fifth day after you give birth. °· You feel sad and depressed. °· Your baby is too sleepy to eat  well. °· Your baby is having trouble sleeping.   °· Your baby is wetting less than 3 diapers in a 24-hour period. °· Your baby has less than 3 stools in a 24-hour period. °· Your baby's skin or the white part of his or her eyes becomes yellow.   °· Your baby is not gaining weight by 5 days of age. °SEEK IMMEDIATE MEDICAL CARE IF:  °· Your baby is overly tired (lethargic) and does not want to wake up and feed. °· Your baby develops an unexplained fever. °Document Released: 05/30/2005 Document Revised: 06/04/2013 Document Reviewed: 11/21/2012 °[employer]® Patient Information ©2015 [employer]. This information is not intended to replace advice given to you by your health care provider. Make sure you discuss any questions you have with your health care provider. ° °

## 2014-11-02 NOTE — Discharge Summary (Signed)
Obstetric Discharge Summary Reason for Admission: onset of labor Prenatal Procedures: ultrasound Intrapartum Procedures: pitocin & AROM augmentation / spontaneous vaginal delivery Postpartum Procedures: none Complications-Operative and Postpartum: 1st degree perineal laceration HEMOGLOBIN  Date Value Ref Range Status  11/02/2014 9.9* 12.0 - 15.0 g/dL Final   HCT  Date Value Ref Range Status  11/02/2014 31.8* 36.0 - 46.0 % Final    Physical Exam:  General: alert, cooperative and no distress Lochia: appropriate Uterine Fundus: firm, midline, U-1 Perineum: 1st degree repair healing well, no significant drainage, no dehiscence, no significant erythema, mild edema DVT Evaluation: No evidence of DVT seen on physical exam. Negative Homan's sign. No cords or calf tenderness. No significant calf/ankle edema.  Discharge Diagnoses: Term Pregnancy-delivered  Discharge Information: Date: 11/02/2014 Activity: pelvic rest Diet: routine Medications: PNV, Ibuprofen and Iron Condition: stable Instructions: refer to practice specific booklet Discharge to: home Follow-up Information    Follow up with MODY,VAISHALI R, MD. Schedule an appointment as soon as possible for a visit in 2 weeks.   Specialty:  Obstetrics and Gynecology   Why:  For Postpartum Depression Evaluation - [place] Postpartum Depression Scale   Contact information:   [facility] [place] [address] [phone]       Follow up with MODY,VAISHALI R, MD. Schedule an appointment as soon as possible for a visit in 6 weeks.   Specialty:  Obstetrics and Gynecology   Why:  postpartum visit   Contact information:   [facility] [place] [id number] [phone]       Newborn Data: Live born female on 11/01/2014 Birth Weight: 6 lb 7.4 oz (2930 g) APGAR: 8, 9  Home with mother.  Raelyn MoraAWSON, ROLITTA, M MSN, CNM 11/02/2014, 10:39 AM

## 2014-11-02 NOTE — Progress Notes (Signed)
Patient ID: Sheila Burns, female   DOB: 02/23/1976, 39 y.o.   MRN: [id number] PPD # 1 SVD  S:  Reports feeling well. Desires early discharge home today.             Tolerating po/ No nausea or vomiting             Bleeding is light             Pain controlled with ibuprofen (OTC) / wearing ice pack soothes sore bottom             Up ad lib / ambulatory / voiding without difficulties    Newborn  Information for the patient's newborn:  Lestine Boxrnett, Girl Dezyrae [[id number]]  female  breast feeding     O:  A & O x 3, in no apparent distress              VS:  Filed Vitals:   11/01/14 1753 11/01/14 1847 11/01/14 2233 11/02/14 0616  BP: 98/58 105/73 103/65 106/64  Pulse: 81 88 75 60  Temp: 98.5 F (36.9 C) 97.8 F (36.6 C) 97.8 F (36.6 C) 98 F (36.7 C)  TempSrc: Oral Oral Oral Oral  Resp: 18 17 16 18   Height:      Weight:      SpO2:        LABS:  Recent Labs  11/01/14 0620 11/02/14 0558  WBC 12.0* 13.6*  HGB 10.3* 9.9*  HCT 31.9* 31.8*  PLT 365 339    Blood type: --/--/O POS (05/21 0620)  Rubella: Immune (10/11 0000)   I&O: I/O last 3 completed shifts: In: -  Out: 1839 [Urine:1400; Blood:439]             Lungs: Clear and unlabored  Heart: regular rate and rhythm / no murmurs  Abdomen: soft, non-tender, non-distended              Fundus: firm, non-tender, U-1  Perineum: 1st degree repair healing well, no edema - ice pack in place  Lochia: minimal  Extremities: no edema, no calf pain or tenderness, no Homans    A/P: PPD # 1  39 y.o., [id number] / S/P SVD with 1st degree laceration  Principal Problem:    Postpartum care following vaginal delivery (5/21)  Active Problems:    Iron deficiency anemia of pregnancy    Acute blood loss anemia   Doing well - stable status  Routine post partum orders  Early discharge home today    Sheila Burns, M, MSN, CNM 11/02/2014, 10:00 AM

## 2014-11-08 ENCOUNTER — Inpatient Hospital Stay (HOSPITAL_COMMUNITY): Admission: RE | Admit: 2014-11-08 | Payer: 59 | Source: Ambulatory Visit

## 2018-06-19 ENCOUNTER — Encounter: Payer: Self-pay | Admitting: Emergency Medicine

## 2018-07-19 ENCOUNTER — Ambulatory Visit: Payer: Self-pay | Admitting: Psychiatry

## 2018-09-29 ENCOUNTER — Other Ambulatory Visit: Payer: Self-pay | Admitting: Psychiatry

## 2018-09-30 NOTE — Telephone Encounter (Signed)
No recent appts in epic, need to pull paper chart

## 2018-10-01 NOTE — Telephone Encounter (Signed)
Has appt tomorrow at 9:45am

## 2018-10-02 ENCOUNTER — Ambulatory Visit (INDEPENDENT_AMBULATORY_CARE_PROVIDER_SITE_OTHER): Payer: BLUE CROSS/BLUE SHIELD | Admitting: Psychiatry

## 2018-10-02 ENCOUNTER — Encounter: Payer: Self-pay | Admitting: Psychiatry

## 2018-10-02 ENCOUNTER — Other Ambulatory Visit: Payer: Self-pay

## 2018-10-02 DIAGNOSIS — F333 Major depressive disorder, recurrent, severe with psychotic symptoms: Secondary | ICD-10-CM

## 2018-10-02 MED ORDER — ARIPIPRAZOLE 15 MG PO TABS: 15.0000 mg | ORAL_TABLET | Freq: Every day | ORAL | 3 refills | Status: DC

## 2018-10-02 MED ORDER — FLUOXETINE HCL 40 MG PO CAPS: 40.0000 mg | ORAL_CAPSULE | Freq: Every day | ORAL | 3 refills | Status: DC

## 2018-10-02 NOTE — Progress Notes (Signed)
Sheila Burns [id number] 02/13/1976 42 y.o.  Subjective:   Patient ID:  Sheila Burns is a 43 y.o. (DOB 07/27/1975) female.  Chief Complaint:  Chief Complaint  Patient presents with  . Follow-up    Medication Management    HPI Sheila Burns presents to the office today for follow-up of anxiety and depression.   Last visit May 2019 without med changes.  Last med change March 2017 to this dose Abilify 15.  Doing really well, steady without abrupt mood changes.  Patient reports stable mood and denies depressed or irritable moods.  Patient denies any recent difficulty with anxiety.  Patient denies difficulty with sleep initiation or maintenance. Denies appetite disturbance.  Patient reports that energy and motivation have been good.  Patient denies any difficulty with concentration.  Patient denies any suicidal ideation.  Covid increased anxiety for awhile.  Family doing well, 4 D, 6 S, 7 S. 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  Past Psychiatric Medication Trials: Fluoxetine, sertraline, Wellbutrin, Trintellix, Abilify, buspirone She has been under my psychiatric care since 2008  Review of Systems:  Review of Systems  Neurological: Negative for tremors and weakness.    Medications: I have reviewed the patient's current medications.  Current Outpatient Medications  Medication Sig Dispense Refill  . acetaminophen (TYLENOL) 325 MG tablet Take 650 mg by mouth every 6 (six) hours as needed for mild pain or headache.    . ARIPiprazole (ABILIFY) 15 MG tablet Take 1 tablet (15 mg total) by mouth daily. 90 tablet 3  . FLUoxetine (PROZAC) 40 MG capsule Take 1 capsule (40 mg total) by mouth daily. 90 capsule 3  . ibuprofen (ADVIL,MOTRIN) 600 MG tablet Take 1 tablet (600 mg total) by mouth every 6 (six) hours. 30 tablet 0  . SUMAtriptan (IMITREX) 50 MG tablet TAKE AS DIRECTED FOR  MIGRAINE  HEADACHE     No current facility-administered medications for this visit.      Medication Side Effects: None  Allergies: No Known Allergies  Past Medical History:  Diagnosis Date  . Acute blood loss anemia 11/02/2014  . Anemia   . Anxiety   . Cystitis, unspecified   . Depression   . Dyspareunia   . Headache(784.0)    miagraine  . History of chicken pox   . Iron deficiency anemia of pregnancy 11/02/2014  . Normal pregnancy (IOL / PD / LGA) 04/30/2011  . Postpartum care following vaginal delivery 11/01/2014  . Unspecified hemorrhoids without mention of complication   . Vulvar vestibulitis     Family History  Problem Relation Age of Onset  . Anesthesia problems Neg Hx   . Hypotension Neg Hx   . Malignant hyperthermia Neg Hx   . Pseudochol deficiency Neg Hx   . Peripheral vascular disease Mother   . Pulmonary fibrosis Father   . Depression Sister   . Depression Brother   . Cancer Paternal Uncle        lung  . Cancer Maternal Grandfather        prostate    Social History   Socioeconomic History  . Marital status: Married    Spouse name: Not on file  . Number of children: Not on file  . Years of education: Not on file  . Highest education level: Not on file  Occupational History  . Not on file  Social Needs  . Financial resource strain: Not on file  . Food insecurity:    Worry: Not on file  Inability: Not on file  . Transportation needs:    Medical: Not on file    Non-medical: Not on file  Tobacco Use  . Smoking status: Never Smoker  . Smokeless tobacco: Never Used  Substance and Sexual Activity  . Alcohol use: No  . Drug use: No  . Sexual activity: Yes  Lifestyle  . Physical activity:    Days per week: Not on file    Minutes per session: Not on file  . Stress: Not on file  Relationships  . Social connections:    Talks on phone: Not on file    Gets together: Not on file    Attends religious service: Not on file    Active member of club or organization: Not on file    Attends meetings of clubs or organizations: Not on file     Relationship status: Not on file  . Intimate partner violence:    Fear of current or ex partner: Not on file    Emotionally abused: Not on file    Physically abused: Not on file    Forced sexual activity: Not on file  Other Topics Concern  . Not on file  Social History Narrative  . Not on file    Past Medical History, Surgical history, Social history, and Family history were reviewed and updated as appropriate.   Please see review of systems for further details on the patient's review from today.   Objective:   Physical Exam:  There were no vitals taken for this visit.  Physical Exam Neurological:     Mental Status: She is alert and oriented to person, place, and time.     Cranial Nerves: No dysarthria.  Psychiatric:        Attention and Perception: Attention normal.        Mood and Affect: Mood normal.        Speech: Speech normal.        Behavior: Behavior is cooperative.        Thought Content: Thought content normal. Thought content is not paranoid or delusional. Thought content does not include homicidal or suicidal ideation. Thought content does not include homicidal or suicidal plan.        Cognition and Memory: Cognition and memory normal.        Judgment: Judgment normal.     Lab Review:  No results found for: NA, K, CL, CO2, GLUCOSE, BUN, CREATININE, CALCIUM, PROT, ALBUMIN, AST, ALT, ALKPHOS, BILITOT, GFRNONAA, GFRAA     Component Value Date/Time   WBC 13.6 (H) 11/02/2014 0558   RBC 3.87 11/02/2014 0558   HGB 9.9 (L) 11/02/2014 0558   HCT 31.8 (L) 11/02/2014 0558   PLT 339 11/02/2014 0558   MCV 82.2 11/02/2014 0558   MCH 25.6 (L) 11/02/2014 0558   MCHC 31.1 11/02/2014 0558   RDW 16.0 (H) 11/02/2014 0558    No results found for: POCLITH, LITHIUM   No results found for: PHENYTOIN, PHENOBARB, VALPROATE, CBMZ   .res Assessment: Plan:    Depression, major, recurrent, severe with psychosis (HCC)   Sheila Burns has a history of severe major depression with  psychotic features in the past which have gone into remission with the current regimen of fluoxetine 40 mg a day and Abilify 15 mg a day last med change was in March 2017 when she needed an increase in Abilify to the current dosage.  Discussed potential metabolic side effects associated with atypical antipsychotics, as well as potential risk for movement side  effects. Advised pt to contact office if movement side effects occur.  She has no current symptoms consistent with tardive dyskinesia.  She has had inadequate response at lower doses of Abilify.  Disc previous SS relapse.  Continue current meds without change.  Follow-up 1 year because of stability  I connected with patient by a video enabled telemedicine application or telephone, with their informed consent, and verified patient privacy and that I am speaking with the correct person using two identifiers.  I was located at office and patient at home.  Meredith Staggers, MD, DFAPA  Please see After Visit Summary for patient specific instructions.  No future appointments.  No orders of the defined types were placed in this encounter.     -------------------------------

## 2019-08-18 ENCOUNTER — Other Ambulatory Visit: Payer: Self-pay | Admitting: Psychiatry

## 2019-08-20 ENCOUNTER — Other Ambulatory Visit: Payer: Self-pay | Admitting: Psychiatry

## 2019-09-24 ENCOUNTER — Encounter: Payer: Self-pay | Admitting: Psychiatry

## 2019-09-24 ENCOUNTER — Other Ambulatory Visit: Payer: Self-pay

## 2019-09-24 ENCOUNTER — Ambulatory Visit (INDEPENDENT_AMBULATORY_CARE_PROVIDER_SITE_OTHER): Payer: 59 | Admitting: Psychiatry

## 2019-09-24 DIAGNOSIS — F333 Major depressive disorder, recurrent, severe with psychotic symptoms: Secondary | ICD-10-CM

## 2019-09-24 MED ORDER — FLUOXETINE HCL 40 MG PO CAPS: 40.0000 mg | ORAL_CAPSULE | Freq: Every day | ORAL | 3 refills | Status: DC

## 2019-09-24 MED ORDER — ARIPIPRAZOLE 15 MG PO TABS: 15.0000 mg | ORAL_TABLET | Freq: Every day | ORAL | 3 refills | Status: DC

## 2019-09-24 NOTE — Progress Notes (Signed)
Sheila Burns [id number] Sep 02, 1975 44 y.o.  Subjective:   Patient ID:  Sheila Burns is a 44 y.o. (DOB [birth year]) female.  Chief Complaint:  Chief Complaint  Patient presents with  . Follow-up    anxiety and depression and med managmeent    HPI Sheila Burns presents to the office today for follow-up of anxiety and depression.   Last visit April 2020 without med changes.  Last med change March 2017 to this dose Abilify 15.  Sister died 29 yo 09/18/2020with alcoholism all her adult life and apparently PE.  Has 2 other brothers.  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    Overall Doing really well, steady without abrupt mood changes.  No highs or lows.  Pleased with meds. Patient reports stable mood and denies depressed or irritable moods.  Patient denies any recent difficulty with anxiety.  Patient denies difficulty with sleep initiation or maintenance. Denies appetite disturbance.  Patient reports that energy and motivation have been good.  Patient denies any difficulty with concentration.  Patient denies any suicidal ideation.  [facility] Covid and hosp 1 night. She had it also minor case.  Both Doing well now.  Family doing well, 5 D, 6 S, 8 S. 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  Past Psychiatric Medication Trials: Fluoxetine, sertraline, Wellbutrin, Trintellix, Abilify, buspirone She has been under my psychiatric care since 2008  Review of Systems:  Review of Systems  Musculoskeletal: Positive for myalgias.  Neurological: Negative for tremors and weakness.    Medications: I have reviewed the patient's current medications.  Current Outpatient Medications  Medication Sig Dispense Refill  . acetaminophen (TYLENOL) 325 MG tablet Take 650 mg by mouth every 6 (six) hours as needed for mild pain or headache.    . ARIPiprazole (ABILIFY) 15 MG tablet Take 1 tablet by mouth daily. 90 tablet 0  . FLUoxetine (PROZAC) 40 MG capsule Take 1 capsule by mouth daily. 90 capsule 0   . ibuprofen (ADVIL,MOTRIN) 600 MG tablet Take 1 tablet (600 mg total) by mouth every 6 (six) hours. 30 tablet 0  . SUMAtriptan (IMITREX) 50 MG tablet TAKE AS DIRECTED FOR  MIGRAINE  HEADACHE     No current facility-administered medications for this visit.    Medication Side Effects: None  Allergies: No Known Allergies  Past Medical History:  Diagnosis Date  . Acute blood loss anemia 11/02/2014  . Anemia   . Anxiety   . Cystitis, unspecified   . Depression   . Dyspareunia   . Headache(784.0)    miagraine  . History of chicken pox   . Iron deficiency anemia of pregnancy 11/02/2014  . Normal pregnancy (IOL / PD / LGA) 04/30/2011  . Postpartum care following vaginal delivery 11/01/2014  . Unspecified hemorrhoids without mention of complication   . Vulvar vestibulitis     Family History  Problem Relation Age of Onset  . Anesthesia problems Neg Hx   . Hypotension Neg Hx   . Malignant hyperthermia Neg Hx   . Pseudochol deficiency Neg Hx   . Peripheral vascular disease Mother   . Pulmonary fibrosis Father   . Depression Sister   . Depression Brother   . Cancer Paternal Uncle        lung  . Cancer Maternal Grandfather        prostate    Social History   Socioeconomic History  . Marital status: Married    Spouse name: Not on file  .  Number of children: Not on file  . Years of education: Not on file  . Highest education level: Not on file  Occupational History  . Not on file  Tobacco Use  . Smoking status: Never Smoker  . Smokeless tobacco: Never Used  Substance and Sexual Activity  . Alcohol use: No  . Drug use: No  . Sexual activity: Yes  Other Topics Concern  . Not on file  Social History Narrative  . Not on file   Social Determinants of Health   Financial Resource Strain:   . Difficulty of Paying Living Expenses:   Food Insecurity:   . Worried About Charity fundraiser in the Last Year:   . Arboriculturist in the Last Year:   Transportation Needs:   .  Film/video editor (Medical):   Marland Kitchen Lack of Transportation (Non-Medical):   Physical Activity:   . Days of Exercise per Week:   . Minutes of Exercise per Session:   Stress:   . Feeling of Stress :   Social Connections:   . Frequency of Communication with Friends and Family:   . Frequency of Social Gatherings with Friends and Family:   . Attends Religious Services:   . Active Member of Clubs or Organizations:   . Attends Archivist Meetings:   Marland Kitchen Marital Status:   Intimate Partner Violence:   . Fear of Current or Ex-Partner:   . Emotionally Abused:   Marland Kitchen Physically Abused:   . Sexually Abused:     Past Medical History, Surgical history, Social history, and Family history were reviewed and updated as appropriate.   Please see review of systems for further details on the patient's review from today.   Objective:   Physical Exam:  There were no vitals taken for this visit.  Physical Exam Neurological:     Mental Status: She is alert and oriented to person, place, and time.     Cranial Nerves: No dysarthria.  Psychiatric:        Attention and Perception: Attention normal.        Mood and Affect: Mood normal. Mood is not anxious or depressed.        Speech: Speech normal.        Behavior: Behavior is cooperative.        Thought Content: Thought content normal. Thought content is not paranoid or delusional. Thought content does not include homicidal or suicidal ideation. Thought content does not include homicidal or suicidal plan.        Cognition and Memory: Cognition and memory normal.        Judgment: Judgment normal.     Lab Review:  No results found for: NA, K, CL, CO2, GLUCOSE, BUN, CREATININE, CALCIUM, PROT, ALBUMIN, AST, ALT, ALKPHOS, BILITOT, GFRNONAA, GFRAA     Component Value Date/Time   WBC 13.6 (H) 11/02/2014 0558   RBC 3.87 11/02/2014 0558   HGB 9.9 (L) 11/02/2014 0558   HCT 31.8 (L) 11/02/2014 0558   PLT 339 11/02/2014 0558   MCV 82.2 11/02/2014  0558   MCH 25.6 (L) 11/02/2014 0558   MCHC 31.1 11/02/2014 0558   RDW 16.0 (H) 11/02/2014 0558    No results found for: POCLITH, LITHIUM   No results found for: PHENYTOIN, PHENOBARB, VALPROATE, CBMZ   .res Assessment: Plan:    Depression, major, recurrent, severe with psychosis ([facility])   Raquel Sarna has a history of severe major depression with psychotic features in the past which have  gone into remission with the current regimen of fluoxetine 40 mg a day and Abilify 15 mg a day last med change was in March 2017 when she needed an increase in Abilify to the current dosage. She has remained stable with good response.  Discussed potential metabolic side effects associated with atypical antipsychotics, as well as potential risk for movement side effects. Advised pt to contact office if movement side effects occur.  She has no current symptoms consistent with tardive dyskinesia.  She has had inadequate response at lower doses of Abilify.  Disc previous SS relapse.  Continue current meds without change.  Follow-up 1 year because of stability  Meredith Staggers, MD, DFAPA  Please see After Visit Summary for patient specific instructions.  No future appointments.  No orders of the defined types were placed in this encounter.     -------------------------------

## 2020-03-30 ENCOUNTER — Other Ambulatory Visit: Payer: Self-pay | Admitting: Neurosurgery

## 2020-03-30 DIAGNOSIS — N63 Unspecified lump in unspecified breast: Secondary | ICD-10-CM

## 2020-04-24 ENCOUNTER — Other Ambulatory Visit: Payer: Self-pay

## 2020-04-24 ENCOUNTER — Ambulatory Visit
Admission: RE | Admit: 2020-04-24 | Discharge: 2020-04-24 | Disposition: A | Discharge status: Home / Self Care | Payer: 59 | Source: Ambulatory Visit | Attending: Neurosurgery | Admitting: Neurosurgery

## 2020-04-24 ENCOUNTER — Other Ambulatory Visit: Payer: Self-pay | Admitting: Neurosurgery

## 2020-04-24 ENCOUNTER — Ambulatory Visit
Admission: RE | Admit: 2020-04-24 | Discharge: 2020-04-24 | Disposition: A | Discharge status: Home / Self Care | Payer: Self-pay | Source: Ambulatory Visit | Attending: Neurosurgery | Admitting: Neurosurgery

## 2020-04-24 DIAGNOSIS — N63 Unspecified lump in unspecified breast: Secondary | ICD-10-CM

## 2020-08-19 ENCOUNTER — Other Ambulatory Visit: Payer: Self-pay | Admitting: Psychiatry

## 2020-08-19 DIAGNOSIS — F333 Major depressive disorder, recurrent, severe with psychotic symptoms: Secondary | ICD-10-CM

## 2020-08-19 NOTE — Telephone Encounter (Signed)
Please review

## 2020-09-23 ENCOUNTER — Encounter: Payer: Self-pay | Admitting: Psychiatry

## 2020-09-23 ENCOUNTER — Telehealth (INDEPENDENT_AMBULATORY_CARE_PROVIDER_SITE_OTHER): Payer: 59 | Admitting: Psychiatry

## 2020-09-23 DIAGNOSIS — F333 Major depressive disorder, recurrent, severe with psychotic symptoms: Secondary | ICD-10-CM | POA: Diagnosis not present

## 2020-09-23 MED ORDER — ARIPIPRAZOLE 15 MG PO TABS: 15.0000 mg | ORAL_TABLET | Freq: Every day | ORAL | 3 refills | Status: DC

## 2020-09-23 MED ORDER — FLUOXETINE HCL 40 MG PO CAPS: 40.0000 mg | ORAL_CAPSULE | Freq: Every day | ORAL | 3 refills | Status: DC

## 2020-09-23 NOTE — Progress Notes (Signed)
Sheila Burns [id number] 1975/09/01 45 y.o.  Video Visit via My Chart  I connected with pt by My Chart and verified that I am speaking with the correct person using two identifiers.   I discussed the limitations, risks, security and privacy concerns of performing an evaluation and management service by My Chart  and the availability of in person appointments. I also discussed with the patient that there may be a patient responsible charge related to this service. The patient expressed understanding and agreed to proceed.  I discussed the assessment and treatment plan with the patient. The patient was provided an opportunity to ask questions and all were answered. The patient agreed with the plan and demonstrated an understanding of the instructions.   The patient was advised to call back or seek an in-person evaluation if the symptoms worsen or if the condition fails to improve as anticipated.  I provided 30 minutes of video time during this encounter.  The patient was located at home and the provider was located office. Session started 1055 and an hour later ended 1125  Subjective:   Patient ID:  Sheila Burns is a 45 y.o. (DOB Nov 14, 1975) female.  Chief Complaint:  Chief Complaint  Patient presents with  . Follow-up  . Depression  . Anxiety    HPI Sheila Burns presents to the office today for follow-up of anxiety and depression.   visit April 2020 without med changes.  Last med change March 2017 to this dose Abilify 15.  2019/10/05 appt noted: Sister died 48 yo 09/29/2020with alcoholism all her adult life and apparently PE.  Has 2 other brothers.  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   Overall Doing really well, steady without abrupt mood changes.  No highs or lows.  Pleased with meds. Patient reports stable mood and denies depressed or irritable moods.  Patient denies any recent difficulty with anxiety.  Patient denies difficulty with sleep initiation or maintenance. Denies  appetite disturbance.  Patient reports that energy and motivation have been good.  Patient denies any difficulty with concentration.  Patient denies any suicidal ideation. [facility] Covid and hosp 1 night. She had it also minor case.  Both Doing well now.  Family doing well, 5 D, 6 S, 8 S. 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 Plan no med changes  10-04-2020 appointment with following noted:  Busy but good.  Some down days in last few mos.  Managing OK.  If exercise thinks it will help a lot.  More motivated to be outside.  Satisfied with meds generally Still on fluoxetine 40 and Abilify 15 Wonders about increasing it.  Sleep pretty good unless kids interrupt.  No consistent depression. Good function.  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 Anxiety is OK  Past Psychiatric Medication Trials: Fluoxetine, sertraline, Wellbutrin, Trintellix, Abilify 15, buspirone She has been under my psychiatric care since 2008  Review of Systems:  Review of Systems  Musculoskeletal: Negative for myalgias.  Neurological: Positive for headaches. Negative for tremors and weakness.    Medications: I have reviewed the patient's current medications.  Current Outpatient Medications  Medication Sig Dispense Refill  . acetaminophen (TYLENOL) 325 MG tablet Take 650 mg by mouth every 6 (six) hours as needed for mild pain or headache.    . ARIPiprazole (ABILIFY) 15 MG tablet Take 1 tablet by mouth daily. 90 tablet 0  . FLUoxetine (PROZAC) 40 MG capsule Take 1 capsule by mouth daily. 90 capsule  0  . ibuprofen (ADVIL,MOTRIN) 600 MG tablet Take 1 tablet (600 mg total) by mouth every 6 (six) hours. 30 tablet 0  . SUMAtriptan (IMITREX) 50 MG tablet TAKE AS DIRECTED FOR  MIGRAINE  HEADACHE     No current facility-administered medications for this visit.    Medication Side Effects: None  Allergies: No Known Allergies  Past Medical History:  Diagnosis Date  . Acute blood loss anemia 11/02/2014  . Anemia    . Anxiety   . Cystitis, unspecified   . Depression   . Dyspareunia   . Headache(784.0)    miagraine  . History of chicken pox   . Iron deficiency anemia of pregnancy 11/02/2014  . Normal pregnancy (IOL / PD / LGA) 04/30/2011  . Postpartum care following vaginal delivery 11/01/2014  . Unspecified hemorrhoids without mention of complication   . Vulvar vestibulitis     Family History  Problem Relation Age of Onset  . Anesthesia problems Neg Hx   . Hypotension Neg Hx   . Malignant hyperthermia Neg Hx   . Pseudochol deficiency Neg Hx   . Peripheral vascular disease Mother   . Pulmonary fibrosis Father   . Depression Sister   . Depression Brother   . Cancer Paternal Uncle        lung  . Cancer Maternal Grandfather        prostate    Social History   Socioeconomic History  . Marital status: Married    Spouse name: Not on file  . Number of children: Not on file  . Years of education: Not on file  . Highest education level: Not on file  Occupational History  . Not on file  Tobacco Use  . Smoking status: Never Smoker  . Smokeless tobacco: Never Used  Substance and Sexual Activity  . Alcohol use: No  . Drug use: No  . Sexual activity: Yes  Other Topics Concern  . Not on file  Social History Narrative  . Not on file   Social Determinants of Health   Financial Resource Strain: Not on file  Food Insecurity: Not on file  Transportation Needs: Not on file  Physical Activity: Not on file  Stress: Not on file  Social Connections: Not on file  Intimate Partner Violence: Not on file    Past Medical History, Surgical history, Social history, and Family history were reviewed and updated as appropriate.   Please see review of systems for further details on the patient's review from today.   Objective:   Physical Exam:  There were no vitals taken for this visit.  Physical Exam Neurological:     Mental Status: She is alert and oriented to person, place, and time.      Cranial Nerves: No dysarthria.  Psychiatric:        Attention and Perception: Attention normal.        Mood and Affect: Mood is depressed. Mood is not anxious.        Speech: Speech normal.        Behavior: Behavior is cooperative.        Thought Content: Thought content normal. Thought content is not paranoid or delusional. Thought content does not include homicidal or suicidal ideation. Thought content does not include homicidal or suicidal plan.        Cognition and Memory: Cognition and memory normal.        Judgment: Judgment normal.     Lab Review:  No results found for: NA,  K, CL, CO2, GLUCOSE, BUN, CREATININE, CALCIUM, PROT, ALBUMIN, AST, ALT, ALKPHOS, BILITOT, GFRNONAA, GFRAA     Component Value Date/Time   WBC 13.6 (H) 11/02/2014 0558   RBC 3.87 11/02/2014 0558   HGB 9.9 (L) 11/02/2014 0558   HCT 31.8 (L) 11/02/2014 0558   PLT 339 11/02/2014 0558   MCV 82.2 11/02/2014 0558   MCH 25.6 (L) 11/02/2014 0558   MCHC 31.1 11/02/2014 0558   RDW 16.0 (H) 11/02/2014 0558    No results found for: POCLITH, LITHIUM   No results found for: PHENYTOIN, PHENOBARB, VALPROATE, CBMZ   .res Assessment: Plan:    Depression, major, recurrent, severe with psychosis ([facility])   Sheila Burns has a history of severe major depression with psychotic features in the past which have gone into remission with the current regimen of fluoxetine 40 mg a day and Abilify 15 mg a day last med change was in March 2017 when she needed an increase in Abilify to the current dosage. She has remained stable with good response except more brief periods of depression lately.  No consistent depression.  Discussed potential metabolic side effects associated with atypical antipsychotics, as well as potential risk for movement side effects. Advised pt to contact office if movement side effects occur.  She has no current symptoms consistent with tardive dyskinesia.  She has had inadequate response at lower doses of Abilify.   Disc previous SS relapse.  No higher.  Rec exercise to help mood  Continue current meds without change.  Follow-up 1 year because of stability  Meredith Staggers, MD, DFAPA  Please see After Visit Summary for patient specific instructions.  No future appointments.  No orders of the defined types were placed in this encounter.     -------------------------------

## 2020-12-01 ENCOUNTER — Telehealth: Payer: Self-pay | Admitting: Psychiatry

## 2020-12-01 ENCOUNTER — Other Ambulatory Visit: Payer: Self-pay

## 2020-12-01 DIAGNOSIS — F333 Major depressive disorder, recurrent, severe with psychotic symptoms: Secondary | ICD-10-CM

## 2020-12-01 MED ORDER — ARIPIPRAZOLE 15 MG PO TABS: 15.0000 mg | ORAL_TABLET | Freq: Every day | ORAL | 3 refills | Status: DC

## 2020-12-01 MED ORDER — FLUOXETINE HCL 40 MG PO CAPS: 40.0000 mg | ORAL_CAPSULE | Freq: Every day | ORAL | 3 refills | Status: DC

## 2020-12-01 NOTE — Telephone Encounter (Signed)
Sheila Burns's last prescriptions for Abilify and Fluoxetine were sent to [facility] but she is no longer using them. Please delete [facility] from her pharmacies. Please call her latest prescriptions for Abilify and Fluoxetine to [facility], [address] [place] at [address]. Phone number is [phone].

## 2020-12-01 NOTE — Telephone Encounter (Signed)
Rx cancelled and resent to new pharmacy

## 2021-09-26 ENCOUNTER — Other Ambulatory Visit: Payer: Self-pay

## 2021-09-26 DIAGNOSIS — F333 Major depressive disorder, recurrent, severe with psychotic symptoms: Secondary | ICD-10-CM

## 2021-09-27 NOTE — Telephone Encounter (Signed)
Pt has an appt 5/24 ?

## 2021-09-28 MED ORDER — FLUOXETINE HCL 40 MG PO CAPS: 40.0000 mg | ORAL_CAPSULE | Freq: Every day | ORAL | 0 refills | Status: DC

## 2021-09-28 MED ORDER — ARIPIPRAZOLE 15 MG PO TABS: 15.0000 mg | ORAL_TABLET | Freq: Every day | ORAL | 0 refills | Status: DC

## 2021-11-03 IMAGING — US US BREAST*L* LIMITED INC AXILLA
1 series · 2 of 2 positions shown · non-contrast
Comparison: 08/06/2019 and earlier

CLINICAL DATA: Palpable abnormality in the LEFT breast first found
by the patient's husband 1 month ago

EXAM:
DIGITAL DIAGNOSTIC LEFT MAMMOGRAM WITH CAD AND TOMO
ULTRASOUND LEFT BREAST

[Series 1: us breast*left* limited inc axilla · 0.07mm/px · 2 of 2 slices shown]
[im 1/2]
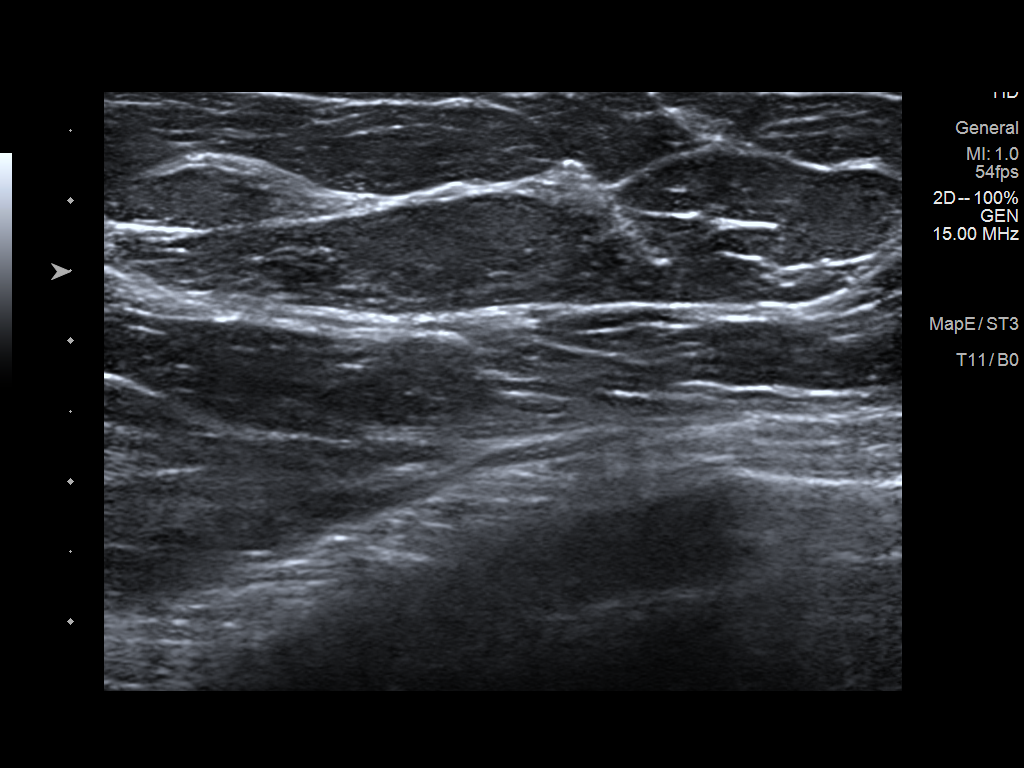
[im 2/2]
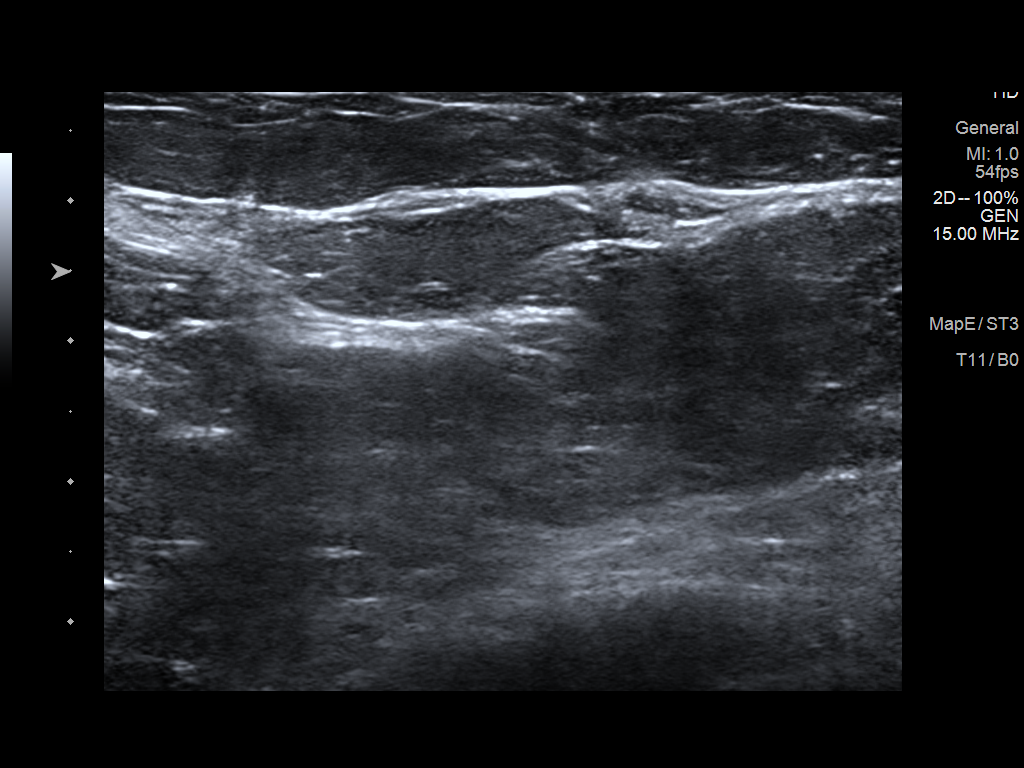

[2 of 2 positions shown; findings below may reference images not displayed]

ACR Breast Density Category b: There are scattered areas of
fibroglandular density.
FINDINGS: No suspicious mass, distortion, or microcalcifications are
identified to suggest presence of malignancy. Spot tangential view
is performed in the area of concern in the LOWER portion of the LEFT
breast. This view shows normal appearing fibrofatty tissue without
mass.

Mammographic images were processed with CAD.

On physical exam, I palpate nonfocal thickening along the 6 o'clock
inframammary fold region of the LEFT breast, slightly tender on
exam. I palpate no discrete mass in this region.

Targeted ultrasound is performed, showing normal appearing
fibroglandular tissue in the area concern along the LOWER portion of
the LEFT breast. No suspicious mass, distortion, or acoustic
shadowing is demonstrated with ultrasound.
IMPRESSION: No mammographic or ultrasound evidence for malignancy.

RECOMMENDATION:
Screening mammogram is recommended in July 2020.

I have discussed the findings and recommendations with the patient.
If applicable, a reminder letter will be sent to the patient
regarding the next appointment.

BI-RADS CATEGORY  1: Negative.

## 2021-11-10 ENCOUNTER — Encounter: Payer: Self-pay | Admitting: Psychiatry

## 2021-11-10 ENCOUNTER — Ambulatory Visit: Payer: BC Managed Care – PPO | Admitting: Psychiatry

## 2021-11-10 DIAGNOSIS — F3342 Major depressive disorder, recurrent, in full remission: Secondary | ICD-10-CM | POA: Diagnosis not present

## 2021-11-10 DIAGNOSIS — F333 Major depressive disorder, recurrent, severe with psychotic symptoms: Secondary | ICD-10-CM | POA: Diagnosis not present

## 2021-11-10 MED ORDER — FLUOXETINE HCL 40 MG PO CAPS: 40.0000 mg | ORAL_CAPSULE | Freq: Every day | ORAL | 3 refills | Status: DC

## 2021-11-10 MED ORDER — ARIPIPRAZOLE 15 MG PO TABS: 15.0000 mg | ORAL_TABLET | Freq: Every day | ORAL | 3 refills | Status: DC

## 2021-11-10 NOTE — Progress Notes (Signed)
Sheila Burns [id number] [birth year] 46 y.o.    Subjective:   Patient ID:  Sheila Burns is a 46 y.o. (DOB 08/09/1975) female.  Chief Complaint:  Chief Complaint  Patient presents with   Follow-up   Depression    HPI Sheila Burns presents to the office today for follow-up of anxiety and depression.   visit April 2020 without med changes.  Last med change March 2017 to this dose Abilify 15.  09/25/2019 appt noted: Sister died 74 yo 02/21/2019 with alcoholism all her adult life and apparently PE.  Has 2 other brothers.  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   Overall Doing really well, steady without abrupt mood changes.  No highs or lows.  Pleased with meds. Patient reports stable mood and denies depressed or irritable moods.  Patient denies any recent difficulty with anxiety.  Patient denies difficulty with sleep initiation or maintenance. Denies appetite disturbance.  Patient reports that energy and motivation have been good.  Patient denies any difficulty with concentration.  Patient denies any suicidal ideation. Sheila Burns Covid and hosp 1 night. She had it also minor case.  Both Doing well now.  Family doing well, 5 D, 6 S, 8 S. 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 Plan no med changes  Sep 24, 2020 appointment with following noted:  Busy but good.  Some down days in last few mos.  Managing OK.  If exercise thinks it will help a lot.  More motivated to be outside.  Satisfied with meds generally Still on fluoxetine 40 and Abilify 15 Wonders about increasing it.  Sleep pretty good unless kids interrupt.  No consistent depression. Good function.  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  3 kids 62, 11, 54 Sheila Burns.  Doing welll. Consistent with meds.  No SE. No concerns. Patient reports stable mood and denies depressed or irritable moods.  Patient denies any recent difficulty with  anxiety.  Patient denies difficulty with sleep initiation or maintenance. Denies appetite disturbance.  Patient reports that energy and motivation have been good.  Patient denies any difficulty with concentration.  Patient denies any suicidal ideation. No sig depressive periods.  Sleeps 10 hours now.  More tired  last 3-4 mos.   Past Psychiatric Medication Trials: Fluoxetine, sertraline, Wellbutrin, Trintellix, Abilify 15, buspirone She has been under my psychiatric care since 2008  Review of Systems:  Review of Systems  Musculoskeletal:  Negative for myalgias.  Neurological:  Positive for headaches. Negative for tremors and weakness.   Medications: I have reviewed the patient's current medications.  Current Outpatient Medications  Medication Sig Dispense Refill   acetaminophen (TYLENOL) 325 MG tablet Take 650 mg by mouth every 6 (six) hours as needed for mild pain or headache.     ibuprofen (ADVIL,MOTRIN) 600 MG tablet Take 1 tablet (600 mg total) by mouth every 6 (six) hours. 30 tablet 0   SUMAtriptan (IMITREX) 50 MG tablet TAKE AS DIRECTED FOR  MIGRAINE  HEADACHE     ARIPiprazole (ABILIFY) 15 MG tablet Take 1 tablet (15 mg total) by mouth daily. 90 tablet 3   FLUoxetine (PROZAC) 40 MG capsule Take 1 capsule (40 mg total) by mouth daily. 90 capsule 3   No current facility-administered medications for this visit.    Medication Side Effects: None  Allergies: No Known Allergies  Past Medical History:  Diagnosis Date   Acute blood  loss anemia 11/02/2014   Anemia    Anxiety    Cystitis, unspecified    Depression    Dyspareunia    Headache(784.0)    miagraine   History of chicken pox    Iron deficiency anemia of pregnancy 11/02/2014   Normal pregnancy (IOL / PD / LGA) 04/30/2011   Postpartum care following vaginal delivery 11/01/2014   Unspecified hemorrhoids without mention of complication    Vulvar vestibulitis     Family History  Problem Relation Age of Onset   Anesthesia  problems Neg Hx    Hypotension Neg Hx    Malignant hyperthermia Neg Hx    Pseudochol deficiency Neg Hx    Peripheral vascular disease Mother    Pulmonary fibrosis Father    Depression Sister    Depression Brother    Cancer Paternal Uncle        lung   Cancer Maternal Grandfather        prostate    Social History   Socioeconomic History   Marital status: Married    Spouse name: Not on file   Number of children: Not on file   Years of education: Not on file   Highest education level: Not on file  Occupational History   Not on file  Tobacco Use   Smoking status: Never   Smokeless tobacco: Never  Substance and Sexual Activity   Alcohol use: No   Drug use: No   Sexual activity: Yes  Other Topics Concern   Not on file  Social History Narrative   Not on file   Social Determinants of Health   Financial Resource Strain: Not on file  Food Insecurity: Not on file  Transportation Needs: Not on file  Physical Activity: Not on file  Stress: Not on file  Social Connections: Not on file  Intimate Partner Violence: Not on file    Past Medical History, Surgical history, Social history, and Family history were reviewed and updated as appropriate.   Please see review of systems for further details on the patient's review from today.   Objective:   Physical Exam:  There were no vitals taken for this visit.  Physical Exam Neurological:     Mental Status: She is alert and oriented to person, place, and time.     Cranial Nerves: No dysarthria.  Psychiatric:        Attention and Perception: Attention normal.        Mood and Affect: Mood is not anxious or depressed.        Speech: Speech normal.        Behavior: Behavior is cooperative.        Thought Content: Thought content normal. Thought content is not paranoid or delusional. Thought content does not include homicidal or suicidal ideation. Thought content does not include suicidal plan.        Cognition and Memory:  Cognition and memory normal.        Judgment: Judgment normal.    Lab Review:  No results found for: NA, K, CL, CO2, GLUCOSE, BUN, CREATININE, CALCIUM, PROT, ALBUMIN, AST, ALT, ALKPHOS, BILITOT, GFRNONAA, GFRAA     Component Value Date/Time   WBC 13.6 (Burns) 11/02/2014 0558   RBC 3.87 11/02/2014 0558   HGB 9.9 (L) 11/02/2014 0558   HCT 31.8 (L) 11/02/2014 0558   PLT 339 11/02/2014 0558   MCV 82.2 11/02/2014 0558   MCH 25.6 (L) 11/02/2014 0558   MCHC 31.1 11/02/2014 0558   RDW 16.0 (  Burns) 11/02/2014 0558    No results found for: POCLITH, LITHIUM   No results found for: PHENYTOIN, PHENOBARB, VALPROATE, CBMZ   .res Assessment: Plan:    Recurrent major depressive disorder, in full remission ([place])  Depression, major, recurrent, severe with psychosis ([place]) - Plan: ARIPiprazole (ABILIFY) 15 MG tablet, FLUoxetine (PROZAC) 40 MG capsule   Raquel Sarna has a history of severe major depression with psychotic features in the past which have gone into remission with the current regimen of fluoxetine 40 mg a day and Abilify 15 mg a day last med change was in March 2017 when she needed an increase in Abilify to the current dosage. She has remained stable with good response except more brief periods of depression lately.  No consistent depression.  Discussed potential metabolic side effects associated with atypical antipsychotics, as well as potential risk for movement side effects. Advised pt to contact office if movement side effects occur.  She has no current symptoms consistent with tardive dyskinesia.  She has had inadequate response at lower doses of Abilify.  Disc previous SS relapse.  No higher. High relapse risk psychotic depression witout meds.  Rec exercise to help mood  Continue current meds without change. Continue fluoxetine 40 mg , Abilify 15 mg daily.  Follow-up 1 year because of stability  Lynder Parents, MD, DFAPA  Please see After Visit Summary for patient specific  instructions.  No future appointments.  No orders of the defined types were placed in this encounter.     -------------------------------

## 2021-11-30 ENCOUNTER — Other Ambulatory Visit: Payer: Self-pay | Admitting: Psychiatry

## 2021-11-30 DIAGNOSIS — F333 Major depressive disorder, recurrent, severe with psychotic symptoms: Secondary | ICD-10-CM

## 2022-10-06 ENCOUNTER — Other Ambulatory Visit: Payer: Self-pay | Admitting: Psychiatry

## 2022-10-06 DIAGNOSIS — F333 Major depressive disorder, recurrent, severe with psychotic symptoms: Secondary | ICD-10-CM

## 2022-11-15 ENCOUNTER — Ambulatory Visit: Payer: BC Managed Care – PPO | Admitting: Psychiatry

## 2023-01-05 ENCOUNTER — Ambulatory Visit: Payer: BC Managed Care – PPO | Admitting: Psychiatry

## 2023-01-06 ENCOUNTER — Other Ambulatory Visit: Payer: Self-pay | Admitting: Psychiatry

## 2023-01-06 DIAGNOSIS — F333 Major depressive disorder, recurrent, severe with psychotic symptoms: Secondary | ICD-10-CM

## 2023-01-06 NOTE — Telephone Encounter (Signed)
Call to RS

## 2023-01-11 NOTE — Telephone Encounter (Signed)
Patient called today and has been rs for 9/16

## 2023-02-27 ENCOUNTER — Encounter: Payer: Self-pay | Admitting: Psychiatry

## 2023-02-27 ENCOUNTER — Ambulatory Visit: Payer: BC Managed Care – PPO | Admitting: Psychiatry

## 2023-02-27 DIAGNOSIS — F333 Major depressive disorder, recurrent, severe with psychotic symptoms: Secondary | ICD-10-CM | POA: Diagnosis not present

## 2023-02-27 DIAGNOSIS — F3342 Major depressive disorder, recurrent, in full remission: Secondary | ICD-10-CM | POA: Diagnosis not present

## 2023-02-27 MED ORDER — ARIPIPRAZOLE 10 MG PO TABS: 10.0000 mg | ORAL_TABLET | Freq: Every day | ORAL | 0 refills | Status: DC

## 2023-02-27 MED ORDER — FLUOXETINE HCL 40 MG PO CAPS
40.0000 mg | ORAL_CAPSULE | Freq: Every day | ORAL | 1 refills | Status: DC
Start: 2023-02-27 — End: 2023-09-28

## 2023-02-27 NOTE — Progress Notes (Signed)
Sheila Burns [id number] 08/24/1975 47 y.o.    Subjective:   Patient ID:  Sheila Burns is a 47 y.o. (DOB 1975/10/14) female.  Chief Complaint:  Chief Complaint  Patient presents with   Follow-up    Mood and anxiety    HPI [name] presents to the office today for follow-up of anxiety and depression.   visit April 2020 without med changes.  Last med change March 2017 to this dose Abilify 15.  12-Oct-2019 appt noted: Sister died 27 yo 05-Oct-2020with alcoholism all her adult life and apparently PE.  Has 2 other brothers.  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   Overall Doing really well, steady without abrupt mood changes.  No highs or lows.  Pleased with meds. Patient reports stable mood and denies depressed or irritable moods.  Patient denies any recent difficulty with anxiety.  Patient denies difficulty with sleep initiation or maintenance. Denies appetite disturbance.  Patient reports that energy and motivation have been good.  Patient denies any difficulty with concentration.  Patient denies any suicidal ideation. Tim H Covid and hosp 1 night. She had it also minor case.  Both Doing well now.  Family doing well, 5 D, 6 S, 8 S. 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 Plan no med changes  2020-10-11 appointment with following noted:  Busy but good.  Some down days in last few mos.  Managing OK.  If exercise thinks it will help a lot.  More motivated to be outside.  Satisfied with meds generally Still on fluoxetine 40 and Abilify 15 Wonders about increasing it.  Sleep pretty good unless kids interrupt.  No consistent depression. Good function.  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  3 kids 7, 9, 11 [name].  Doing welll. Consistent with meds.  No SE. No concerns. Patient reports stable mood and denies depressed or irritable moods.  Patient denies any recent  difficulty with anxiety.  Patient denies difficulty with sleep initiation or maintenance. Denies appetite disturbance.  Patient reports that energy and motivation have been good.  Patient denies any difficulty with concentration.  Patient denies any suicidal ideation. No sig depressive periods.  Sleeps 10 hours now.  More tired  last 3-4 mos.  02/27/23 appt noted: Psych med: fluoxetine 40, Abilify 15 Get really tired at night, exhausted by 930 PM.  Good energy through day. Sleep great 8 hours.  No other health issues.  A little anxiety but dep under control.  Stressed and nervous with triggers normally.  Doing well with mental health otherwise. No dep.  No fear.  Past Psychiatric Medication Trials: Fluoxetine, sertraline, Wellbutrin, Trintellix, Abilify 15, buspirone She has been under my psychiatric care since 2008  Review of Systems:  Review of Systems  Musculoskeletal:  Negative for myalgias.  Neurological:  Positive for headaches. Negative for tremors and weakness.    Medications: I have reviewed the patient's current medications.  Current Outpatient Medications  Medication Sig Dispense Refill   acetaminophen (TYLENOL) 325 MG tablet Take 650 mg by mouth every 6 (six) hours as needed for mild pain or headache.     ibuprofen (ADVIL,MOTRIN) 600 MG tablet Take 1 tablet (600 mg total) by mouth every 6 (six) hours. 30 tablet 0   SUMAtriptan (IMITREX) 50 MG tablet TAKE AS DIRECTED FOR  MIGRAINE  HEADACHE     ARIPiprazole (ABILIFY) 10 MG tablet  Take 1 tablet (10 mg total) by mouth daily. 90 tablet 0   FLUoxetine (PROZAC) 40 MG capsule Take 1 capsule (40 mg total) by mouth daily. 90 capsule 1   No current facility-administered medications for this visit.    Medication Side Effects: None  Allergies: No Known Allergies  Past Medical History:  Diagnosis Date   Acute blood loss anemia 11/02/2014   Anemia    Anxiety    Cystitis, unspecified    Depression    Dyspareunia     Headache(784.0)    miagraine   History of chicken pox    Iron deficiency anemia of pregnancy 11/02/2014   Normal pregnancy (IOL / PD / LGA) 04/30/2011   Postpartum care following vaginal delivery 11/01/2014   Unspecified hemorrhoids without mention of complication    Vulvar vestibulitis     Family History  Problem Relation Age of Onset   Anesthesia problems Neg Hx    Hypotension Neg Hx    Malignant hyperthermia Neg Hx    Pseudochol deficiency Neg Hx    Peripheral vascular disease Mother    Pulmonary fibrosis Father    Depression Sister    Depression Brother    Cancer Paternal Uncle        lung   Cancer Maternal Grandfather        prostate    Social History   Socioeconomic History   Marital status: Married    Spouse name: Not on file   Number of children: Not on file   Years of education: Not on file   Highest education level: Not on file  Occupational History   Not on file  Tobacco Use   Smoking status: Never   Smokeless tobacco: Never  Substance and Sexual Activity   Alcohol use: No   Drug use: No   Sexual activity: Yes  Other Topics Concern   Not on file  Social History Narrative   Not on file   Social Determinants of Health   Financial Resource Strain: Low Risk  (06/14/2022)   Received from [facility], [facility]   Overall Financial Resource Strain (CARDIA)    Difficulty of Paying Living Expenses: Not hard at all  Food Insecurity: No Food Insecurity (06/14/2022)   Received from [facility], [facility]   Hunger Vital Sign    Worried About Running Out of Food in the Last Year: Never true    Ran Out of Food in the Last Year: Never true  Transportation Needs: No Transportation Needs (06/14/2022)   Received from [employer], [facility]   PRAPARE - Transportation    Lack of Transportation (Medical): No    Lack of Transportation (Non-Medical): No  Physical Activity: Insufficiently Active (06/14/2022)   Received from [facility], [facility]    Exercise Vital Sign    Days of Exercise per Week: 3 days    Minutes of Exercise per Session: 30 min  Stress: No Stress Concern Present (06/14/2022)   Received from [facility], [facility] - Occupational Stress Questionnaire    Feeling of Stress : Not at all  Social Connections: Socially Integrated (06/14/2022)   Received from [facility], [facility]   Social Network    How would you rate your social network (family, work, friends)?: Good participation with social networks  Intimate Partner Violence: Not At Risk (06/14/2022)   Received from [facility], [facility]   HITS    Over the last 12 months how  often did your partner physically hurt you?: 1    Over the last 12 months how often did your partner insult you or talk down to you?: 1    Over the last 12 months how often did your partner threaten you with physical harm?: 1    Over the last 12 months how often did your partner scream or curse at you?: 1    Past Medical History, Surgical history, Social history, and Family history were reviewed and updated as appropriate.   Please see review of systems for further details on the patient's review from today.   Objective:   Physical Exam:  There were no vitals taken for this visit.  Physical Exam Neurological:     Mental Status: She is alert and oriented to person, place, and time.     Cranial Nerves: No dysarthria.  Psychiatric:        Attention and Perception: Attention normal.        Mood and Affect: Mood is not anxious or depressed.        Speech: Speech normal.        Behavior: Behavior is cooperative.        Thought Content: Thought content normal. Thought content is not paranoid or delusional. Thought content does not include homicidal or suicidal ideation. Thought content does not include suicidal plan.        Cognition and Memory: Cognition and memory normal.        Judgment: Judgment normal.     Lab Review:  No  results found for: "NA", "K", "CL", "CO2", "GLUCOSE", "BUN", "CREATININE", "CALCIUM", "PROT", "ALBUMIN", "AST", "ALT", "ALKPHOS", "BILITOT", "GFRNONAA", "GFRAA"     Component Value Date/Time   WBC 13.6 (H) 11/02/2014 0558   RBC 3.87 11/02/2014 0558   HGB 9.9 (L) 11/02/2014 0558   HCT 31.8 (L) 11/02/2014 0558   PLT 339 11/02/2014 0558   MCV 82.2 11/02/2014 0558   MCH 25.6 (L) 11/02/2014 0558   MCHC 31.1 11/02/2014 0558   RDW 16.0 (H) 11/02/2014 0558    No results found for: "POCLITH", "LITHIUM"   No results found for: "PHENYTOIN", "PHENOBARB", "VALPROATE", "CBMZ"   .res Assessment: Plan:    Recurrent major depressive disorder, in full remission ([facility])  Depression, major, recurrent, severe with psychosis ([facility]) - Plan: ARIPiprazole (ABILIFY) 10 MG tablet, FLUoxetine (PROZAC) 40 MG capsule   Irving Burton has a history of severe major depression with psychotic features in the past which have gone into remission with the current regimen of fluoxetine 40 mg a day and Abilify 15 mg a day last med change was in March 2017 when she needed an increase in Abilify to the current dosage. She has remained stable with good response except more brief periods of depression lately.  No consistent depression.  Discussed potential metabolic side effects associated with atypical antipsychotics, as well as potential risk for movement side effects. Advised pt to contact office if movement side effects occur.  She has no current symptoms consistent with tardive dyskinesia.  She has had inadequate response at lower doses of Abilify.  Disc previous SS relapse.  No higher. High relapse risk psychotic depression witout meds.  She is going to talk to her primary care doctor about checking thyroid blood count to ensure they are not to cause for her evening fatigue.  This fatigue has been going on for a year or more.  She is interested in retrying a lower dose of the medication if that might perhaps help with  energy.  We  discussed how this is failed in the past but she has been stable for a number of years and it is reasonable to retry a lower dose of Abilify specifically. We discussed alternatives which were not available when she started the current med regimen including Rexulti, Vraylar, or antidepressants which to either Trintellix or Auvelity.  Continue current meds without change. Continue fluoxetine 40 mg ,  Trial reduction to see if tiredness in pm is better.  Reduce Abilify 10 mg daily.  Follow-up 1 year because of stability, if everything remains stable..  Call if any problems.  Meredith Staggers, MD, DFAPA  Please see After Visit Summary for patient specific instructions.  No future appointments.  No orders of the defined types were placed in this encounter.     -------------------------------

## 2023-05-31 ENCOUNTER — Other Ambulatory Visit: Payer: Self-pay | Admitting: Psychiatry

## 2023-05-31 DIAGNOSIS — F333 Major depressive disorder, recurrent, severe with psychotic symptoms: Secondary | ICD-10-CM

## 2023-09-28 ENCOUNTER — Other Ambulatory Visit: Payer: Self-pay | Admitting: Psychiatry

## 2023-09-28 DIAGNOSIS — F333 Major depressive disorder, recurrent, severe with psychotic symptoms: Secondary | ICD-10-CM

## 2023-11-27 ENCOUNTER — Other Ambulatory Visit: Payer: Self-pay | Admitting: Psychiatry

## 2023-11-27 DIAGNOSIS — F333 Major depressive disorder, recurrent, severe with psychotic symptoms: Secondary | ICD-10-CM

## 2024-02-25 ENCOUNTER — Other Ambulatory Visit: Payer: Self-pay | Admitting: Psychiatry

## 2024-02-25 DIAGNOSIS — F333 Major depressive disorder, recurrent, severe with psychotic symptoms: Secondary | ICD-10-CM

## 2024-02-27 ENCOUNTER — Telehealth: Payer: BC Managed Care – PPO | Admitting: Psychiatry

## 2024-02-27 ENCOUNTER — Encounter: Payer: Self-pay | Admitting: Psychiatry

## 2024-02-27 DIAGNOSIS — F333 Major depressive disorder, recurrent, severe with psychotic symptoms: Secondary | ICD-10-CM

## 2024-02-27 DIAGNOSIS — F3342 Major depressive disorder, recurrent, in full remission: Secondary | ICD-10-CM | POA: Diagnosis not present

## 2024-02-27 MED ORDER — ARIPIPRAZOLE 10 MG PO TABS
10.0000 mg | ORAL_TABLET | Freq: Every day | ORAL | 3 refills | Status: AC
Start: 2024-02-27 — End: ?

## 2024-02-27 MED ORDER — FLUOXETINE HCL 40 MG PO CAPS
40.0000 mg | ORAL_CAPSULE | Freq: Every day | ORAL | 3 refills | Status: DC
Start: 2024-02-27 — End: 2024-03-31

## 2024-02-27 NOTE — Progress Notes (Signed)
 Sheila Burns [id number] Oct 30, 1975 48 y.o.  Video Visit via My Chart  I connected with pt by video using My Chart and verified that I am speaking with the correct person using two identifiers.   I discussed the limitations, risks, security and privacy concerns of performing an evaluation and management service by My Chart  and the availability of in person appointments. I also discussed with the patient that there may be a patient responsible charge related to this service. The patient expressed understanding and agreed to proceed.  I discussed the assessment and treatment plan with the patient. The patient was provided an opportunity to ask questions and all were answered. The patient agreed with the plan and demonstrated an understanding of the instructions.   The patient was advised to call back or seek an in-person evaluation if the symptoms worsen or if the condition fails to improve as anticipated.  I provided 15 minutes of video time during this encounter.  The patient was located at home and the provider was located office. Session [id number]  Subjective:   Patient ID:  Sheila Burns is a 48 y.o. (DOB 12-Jan-1976) female.  Chief Complaint:  Chief Complaint  Patient presents with   Follow-up    Mood and anxiety    HPI Sheila Burns presents to the office today for follow-up of anxiety and depression.   visit April 2020 without med changes.  Last med change March 2017 to this dose Abilify  15.  10-02-19 appt noted: Sister died 35 yo 30-Sep-2020with alcoholism all her adult life and apparently PE.  Has 2 other brothers.  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   Overall Doing really well, steady without abrupt mood changes.  No highs or lows.  Pleased with meds. Patient reports stable mood and denies depressed or irritable moods.  Patient denies any recent difficulty with anxiety.  Patient denies difficulty with sleep initiation or maintenance. Denies appetite disturbance.   Patient reports that energy and motivation have been good.  Patient denies any difficulty with concentration.  Patient denies any suicidal ideation. [facility] Covid and hosp 1 night. She had it also minor case.  Both Doing well now.  Family doing well, 5 D, 6 S, 8 S. 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 Plan no med changes  10/01/20 appointment with following noted:  Busy but good.  Some down days in last few mos.  Managing OK.  If exercise thinks it will help a lot.  More motivated to be outside.  Satisfied with meds generally Still on fluoxetine  40 and Abilify  15 Wonders about increasing it.  Sleep pretty good unless kids interrupt.  No consistent depression. Good function.  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  3 kids 7, 9, 11 [name].  Doing welll. Consistent with meds.  No SE. No concerns. Patient reports stable mood and denies depressed or irritable moods.  Patient denies any recent difficulty with anxiety.  Patient denies difficulty with sleep initiation or maintenance. Denies appetite disturbance.  Patient reports that energy and motivation have been good.  Patient denies any difficulty with concentration.  Patient denies any suicidal ideation. No sig depressive periods.  Sleeps 10 hours now.  More tired  last 3-4 mos.  02/27/23 appt noted: Psych med: fluoxetine  40, Abilify  15 Get really tired at night, exhausted by 930 PM.  Good energy through day. Sleep  great 8 hours.  No other health issues.  A little anxiety but dep under control.  Stressed and nervous with triggers normally.  Doing well with mental health otherwise. No dep.  No fear.  02/27/24 appt noted:  Psych med: fluoxetine  40, Abilify  15 Great.  Some hormonal changes.  Even keel.  Not dep.  A little anxiety at times with stress but manage better with age.   No fear nor intrusive thoughts.   Sleep always good. No  SE Good health.  Shingles in June and flare again.   Kids 9,11, 79 yo and homeschooled.  Doing well.    Past Psychiatric Medication Trials: Fluoxetine , sertraline, Wellbutrin , Trintellix, Abilify  15, buspirone She has been under my psychiatric care since 2008  Review of Systems:  Review of Systems  Musculoskeletal:  Negative for myalgias.  Neurological:  Positive for headaches. Negative for tremors and weakness.    Medications: I have reviewed the patient's current medications.  Current Outpatient Medications  Medication Sig Dispense Refill   acetaminophen  (TYLENOL ) 325 MG tablet Take 650 mg by mouth every 6 (six) hours as needed for mild pain or headache.     ibuprofen  (ADVIL ,MOTRIN ) 600 MG tablet Take 1 tablet (600 mg total) by mouth every 6 (six) hours. 30 tablet 0   SUMAtriptan (IMITREX) 50 MG tablet TAKE AS DIRECTED FOR  MIGRAINE  HEADACHE     ARIPiprazole  (ABILIFY ) 10 MG tablet Take 1 tablet (10 mg total) by mouth daily. 90 tablet 3   FLUoxetine  (PROZAC ) 40 MG capsule Take 1 capsule (40 mg total) by mouth daily. 90 capsule 3   No current facility-administered medications for this visit.    Medication Side Effects: None  Allergies: No Known Allergies  Past Medical History:  Diagnosis Date   Acute blood loss anemia 11/02/2014   Anemia    Anxiety    Cystitis, unspecified    Depression    Dyspareunia    Headache(784.0)    miagraine   History of chicken pox    Iron  deficiency anemia of pregnancy 11/02/2014   Normal pregnancy (IOL / PD / LGA) 04/30/2011   Postpartum care following vaginal delivery 11/01/2014   Unspecified hemorrhoids without mention of complication    Vulvar vestibulitis     Family History  Problem Relation Age of Onset   Anesthesia problems Neg Hx    Hypotension Neg Hx    Malignant hyperthermia Neg Hx    Pseudochol deficiency Neg Hx    Peripheral vascular disease Mother    Pulmonary fibrosis Father    Depression Sister    Depression Brother     Cancer Paternal Uncle        lung   Cancer Maternal Grandfather        prostate    Social History   Socioeconomic History   Marital status: Married    Spouse name: Not on file   Number of children: Not on file   Years of education: Not on file   Highest education level: Not on file  Occupational History   Not on file  Tobacco Use   Smoking status: Never   Smokeless tobacco: Never  Substance and Sexual Activity   Alcohol use: No   Drug use: No   Sexual activity: Yes  Other Topics Concern   Not on file  Social History Narrative   Not on file   Social Drivers of Health   Financial Resource Strain: Low Risk  (07/31/2023)   Received from [facility]  Overall Financial Resource Strain (CARDIA)    Difficulty of Paying Living Expenses: Not hard at all  Food Insecurity: No Food Insecurity (07/31/2023)   Received from [facility]   Hunger Vital Sign    Within the past 12 months, you worried that your food would run out before you got the money to buy more.: Never true    Within the past 12 months, the food you bought just didn't last and you didn't have money to get more.: Never true  Transportation Needs: No Transportation Needs (07/31/2023)   Received from [facility] - Transportation    Lack of Transportation (Medical): No    Lack of Transportation (Non-Medical): No  Physical Activity: Insufficiently Active (07/31/2023)   Received from [facility]   Exercise Vital Sign    On average, how many days per week do you engage in moderate to strenuous exercise (like a brisk walk)?: 3 days    On average, how many minutes do you engage in exercise at this level?: 30 min  Stress: No Stress Concern Present (07/31/2023)   Received from [facility] - Occupational Stress Questionnaire    Feeling of Stress : Only a little  Social Connections: Moderately Integrated (07/31/2023)   Received from [facility]   Social Network     How would you rate your social network (family, work, friends)?: Adequate participation with social networks  Intimate Partner Violence: Not At Risk (07/31/2023)   Received from [facility]   HITS    Over the last 12 months how often did your partner physically hurt you?: Never    Over the last 12 months how often did your partner insult you or talk down to you?: Never    Over the last 12 months how often did your partner threaten you with physical harm?: Never    Over the last 12 months how often did your partner scream or curse at you?: Never    Past Medical History, Surgical history, Social history, and Family history were reviewed and updated as appropriate.   Please see review of systems for further details on the patient's review from today.   Objective:   Physical Exam:  There were no vitals taken for this visit.  Physical Exam Neurological:     Mental Status: She is alert and oriented to person, place, and time.     Cranial Nerves: No dysarthria.  Psychiatric:        Attention and Perception: Attention normal.        Mood and Affect: Mood is not anxious or depressed.        Speech: Speech normal.        Behavior: Behavior is cooperative.        Thought Content: Thought content normal. Thought content is not paranoid or delusional. Thought content does not include homicidal or suicidal ideation. Thought content does not include suicidal plan.        Cognition and Memory: Cognition and memory normal.        Judgment: Judgment normal.     Comments: Good insight.     Lab Review:  No results found for: NA, K, CL, CO2, GLUCOSE, BUN, CREATININE, CALCIUM, PROT, ALBUMIN, AST, ALT, ALKPHOS, BILITOT, GFRNONAA, GFRAA     Component Value Date/Time   WBC 13.6 (H) 11/02/2014 0558   RBC 3.87 11/02/2014 0558   HGB 9.9 (L) 11/02/2014 0558   HCT 31.8 (L) 11/02/2014 9441  PLT 339 11/02/2014 0558   MCV 82.2 11/02/2014 0558   MCH 25.6 (L) 11/02/2014  0558   MCHC 31.1 11/02/2014 0558   RDW 16.0 (H) 11/02/2014 0558    No results found for: POCLITH, LITHIUM   No results found for: PHENYTOIN, PHENOBARB, VALPROATE, CBMZ   .res Assessment: Plan:    Recurrent major depressive disorder, in full remission (HCC)  Depression, major, recurrent, severe with psychosis (HCC) - Plan: ARIPiprazole  (ABILIFY ) 10 MG tablet, FLUoxetine  (PROZAC ) 40 MG capsule   Damien has a history of severe major depression with psychotic features in the past which have gone into remission with the current regimen of fluoxetine  40 mg a day and Abilify  15 mg a day last med change was in March 2017 when she needed an increase in Abilify  to the current dosage. She has remained stable with good response except more brief periods of depression lately.  No consistent depression.  Discussed potential metabolic side effects associated with atypical antipsychotics, as well as potential risk for movement side effects. Advised pt to contact office if movement side effects occur.  She has no current symptoms consistent with tardive dyskinesia.  She has had inadequate response at lower doses of Abilify .  Disc previous SS relapse.  No higher. High relapse risk psychotic depression witout meds.  Continue current meds without change. Continue fluoxetine  40 mg ,  Trial reduction to see if tiredness in pm is better.  Reduce Abilify  10 mg daily.  Follow-up 1 year because of stability, if everything remains stable..  Call if any problems.  Lorene Macintosh, MD, DFAPA  Please see After Visit Summary for patient specific instructions.  No future appointments.  No orders of the defined types were placed in this encounter.     -------------------------------

## 2024-03-31 ENCOUNTER — Other Ambulatory Visit: Payer: Self-pay | Admitting: Psychiatry

## 2024-03-31 DIAGNOSIS — F333 Major depressive disorder, recurrent, severe with psychotic symptoms: Secondary | ICD-10-CM
